# Patient Record
Sex: Male | Born: 1950 | Race: White | Hispanic: No | Marital: Married | Smoking: Former smoker
Health system: Southern US, Community
[De-identification: ages and names within clinical notes are randomized; demographics above are authoritative.]

## PROBLEM LIST (undated history)

## (undated) DIAGNOSIS — G4730 Sleep apnea, unspecified: Secondary | ICD-10-CM

## (undated) DIAGNOSIS — I10 Essential (primary) hypertension: Secondary | ICD-10-CM

## (undated) DIAGNOSIS — J189 Pneumonia, unspecified organism: Secondary | ICD-10-CM

## (undated) DIAGNOSIS — E119 Type 2 diabetes mellitus without complications: Secondary | ICD-10-CM

## (undated) DIAGNOSIS — T4145XA Adverse effect of unspecified anesthetic, initial encounter: Secondary | ICD-10-CM

## (undated) DIAGNOSIS — T8859XA Other complications of anesthesia, initial encounter: Secondary | ICD-10-CM

## (undated) DIAGNOSIS — Z950 Presence of cardiac pacemaker: Secondary | ICD-10-CM

## (undated) DIAGNOSIS — M1990 Unspecified osteoarthritis, unspecified site: Secondary | ICD-10-CM

## (undated) DIAGNOSIS — Z87442 Personal history of urinary calculi: Secondary | ICD-10-CM

## (undated) HISTORY — PX: APPENDECTOMY: SHX54

## (undated) HISTORY — PX: HERNIA REPAIR: SHX51

## (undated) HISTORY — PX: HEMORRHOID SURGERY: SHX153

## (undated) HISTORY — PX: CYST REMOVAL TRUNK: SHX6283

---

## 2010-01-23 ENCOUNTER — Other Ambulatory Visit
Admission: RE | Admit: 2010-01-23 | Discharge: 2010-01-23 | Payer: Self-pay | Source: Home / Self Care | Attending: Oncology | Admitting: Oncology

## 2014-02-18 DIAGNOSIS — E785 Hyperlipidemia, unspecified: Secondary | ICD-10-CM | POA: Diagnosis not present

## 2014-02-18 DIAGNOSIS — E119 Type 2 diabetes mellitus without complications: Secondary | ICD-10-CM | POA: Diagnosis not present

## 2014-02-18 DIAGNOSIS — Z1389 Encounter for screening for other disorder: Secondary | ICD-10-CM | POA: Diagnosis not present

## 2014-02-18 DIAGNOSIS — I10 Essential (primary) hypertension: Secondary | ICD-10-CM | POA: Diagnosis not present

## 2014-02-18 DIAGNOSIS — G4733 Obstructive sleep apnea (adult) (pediatric): Secondary | ICD-10-CM | POA: Diagnosis not present

## 2014-02-18 DIAGNOSIS — J329 Chronic sinusitis, unspecified: Secondary | ICD-10-CM | POA: Diagnosis not present

## 2014-02-18 DIAGNOSIS — D471 Chronic myeloproliferative disease: Secondary | ICD-10-CM | POA: Diagnosis not present

## 2014-02-18 DIAGNOSIS — Z9181 History of falling: Secondary | ICD-10-CM | POA: Diagnosis not present

## 2014-04-15 DIAGNOSIS — I10 Essential (primary) hypertension: Secondary | ICD-10-CM | POA: Diagnosis not present

## 2014-04-15 DIAGNOSIS — E876 Hypokalemia: Secondary | ICD-10-CM | POA: Diagnosis not present

## 2014-04-15 DIAGNOSIS — Z7982 Long term (current) use of aspirin: Secondary | ICD-10-CM | POA: Diagnosis not present

## 2014-04-15 DIAGNOSIS — E118 Type 2 diabetes mellitus with unspecified complications: Secondary | ICD-10-CM | POA: Diagnosis not present

## 2014-04-15 DIAGNOSIS — R05 Cough: Secondary | ICD-10-CM | POA: Diagnosis not present

## 2014-04-15 DIAGNOSIS — E780 Pure hypercholesterolemia: Secondary | ICD-10-CM | POA: Diagnosis not present

## 2014-04-15 DIAGNOSIS — J324 Chronic pansinusitis: Secondary | ICD-10-CM | POA: Diagnosis not present

## 2014-04-15 DIAGNOSIS — J189 Pneumonia, unspecified organism: Secondary | ICD-10-CM | POA: Diagnosis not present

## 2014-04-15 DIAGNOSIS — J329 Chronic sinusitis, unspecified: Secondary | ICD-10-CM | POA: Diagnosis not present

## 2014-04-15 DIAGNOSIS — R040 Epistaxis: Secondary | ICD-10-CM | POA: Diagnosis not present

## 2014-04-15 DIAGNOSIS — Z87891 Personal history of nicotine dependence: Secondary | ICD-10-CM | POA: Diagnosis not present

## 2014-08-26 DIAGNOSIS — Z1389 Encounter for screening for other disorder: Secondary | ICD-10-CM | POA: Diagnosis not present

## 2014-08-26 DIAGNOSIS — E119 Type 2 diabetes mellitus without complications: Secondary | ICD-10-CM | POA: Diagnosis not present

## 2014-08-26 DIAGNOSIS — Z9181 History of falling: Secondary | ICD-10-CM | POA: Diagnosis not present

## 2014-08-26 DIAGNOSIS — I10 Essential (primary) hypertension: Secondary | ICD-10-CM | POA: Diagnosis not present

## 2014-08-26 DIAGNOSIS — E785 Hyperlipidemia, unspecified: Secondary | ICD-10-CM | POA: Diagnosis not present

## 2014-08-26 DIAGNOSIS — Z6841 Body Mass Index (BMI) 40.0 and over, adult: Secondary | ICD-10-CM | POA: Diagnosis not present

## 2014-12-19 DIAGNOSIS — G4733 Obstructive sleep apnea (adult) (pediatric): Secondary | ICD-10-CM | POA: Diagnosis not present

## 2015-03-03 DIAGNOSIS — E785 Hyperlipidemia, unspecified: Secondary | ICD-10-CM | POA: Diagnosis not present

## 2015-03-03 DIAGNOSIS — E119 Type 2 diabetes mellitus without complications: Secondary | ICD-10-CM | POA: Diagnosis not present

## 2015-03-03 DIAGNOSIS — I10 Essential (primary) hypertension: Secondary | ICD-10-CM | POA: Diagnosis not present

## 2015-03-20 DIAGNOSIS — G4733 Obstructive sleep apnea (adult) (pediatric): Secondary | ICD-10-CM | POA: Diagnosis not present

## 2015-04-19 DIAGNOSIS — J0100 Acute maxillary sinusitis, unspecified: Secondary | ICD-10-CM | POA: Diagnosis not present

## 2015-06-26 DIAGNOSIS — G4733 Obstructive sleep apnea (adult) (pediatric): Secondary | ICD-10-CM | POA: Diagnosis not present

## 2015-07-20 DIAGNOSIS — E119 Type 2 diabetes mellitus without complications: Secondary | ICD-10-CM | POA: Diagnosis not present

## 2015-07-20 DIAGNOSIS — H2513 Age-related nuclear cataract, bilateral: Secondary | ICD-10-CM | POA: Diagnosis not present

## 2015-07-27 DIAGNOSIS — G4733 Obstructive sleep apnea (adult) (pediatric): Secondary | ICD-10-CM | POA: Diagnosis not present

## 2015-08-26 DIAGNOSIS — G4733 Obstructive sleep apnea (adult) (pediatric): Secondary | ICD-10-CM | POA: Diagnosis not present

## 2015-09-01 DIAGNOSIS — E785 Hyperlipidemia, unspecified: Secondary | ICD-10-CM | POA: Diagnosis not present

## 2015-09-01 DIAGNOSIS — Z1389 Encounter for screening for other disorder: Secondary | ICD-10-CM | POA: Diagnosis not present

## 2015-09-01 DIAGNOSIS — I10 Essential (primary) hypertension: Secondary | ICD-10-CM | POA: Diagnosis not present

## 2015-09-01 DIAGNOSIS — E119 Type 2 diabetes mellitus without complications: Secondary | ICD-10-CM | POA: Diagnosis not present

## 2015-09-26 DIAGNOSIS — G4733 Obstructive sleep apnea (adult) (pediatric): Secondary | ICD-10-CM | POA: Diagnosis not present

## 2015-10-27 DIAGNOSIS — G4733 Obstructive sleep apnea (adult) (pediatric): Secondary | ICD-10-CM | POA: Diagnosis not present

## 2015-11-26 DIAGNOSIS — G4733 Obstructive sleep apnea (adult) (pediatric): Secondary | ICD-10-CM | POA: Diagnosis not present

## 2015-12-27 DIAGNOSIS — G4733 Obstructive sleep apnea (adult) (pediatric): Secondary | ICD-10-CM | POA: Diagnosis not present

## 2016-01-22 DIAGNOSIS — M5441 Lumbago with sciatica, right side: Secondary | ICD-10-CM | POA: Diagnosis not present

## 2016-01-22 DIAGNOSIS — M5442 Lumbago with sciatica, left side: Secondary | ICD-10-CM | POA: Diagnosis not present

## 2016-01-25 DIAGNOSIS — M47816 Spondylosis without myelopathy or radiculopathy, lumbar region: Secondary | ICD-10-CM | POA: Diagnosis not present

## 2016-01-25 DIAGNOSIS — M5432 Sciatica, left side: Secondary | ICD-10-CM | POA: Diagnosis not present

## 2016-01-25 DIAGNOSIS — I10 Essential (primary) hypertension: Secondary | ICD-10-CM | POA: Diagnosis not present

## 2016-01-25 DIAGNOSIS — M5431 Sciatica, right side: Secondary | ICD-10-CM | POA: Diagnosis not present

## 2016-01-25 DIAGNOSIS — M5186 Other intervertebral disc disorders, lumbar region: Secondary | ICD-10-CM | POA: Diagnosis not present

## 2016-01-26 DIAGNOSIS — G4733 Obstructive sleep apnea (adult) (pediatric): Secondary | ICD-10-CM | POA: Diagnosis not present

## 2016-02-02 DIAGNOSIS — M5431 Sciatica, right side: Secondary | ICD-10-CM | POA: Diagnosis not present

## 2016-02-02 DIAGNOSIS — M48061 Spinal stenosis, lumbar region without neurogenic claudication: Secondary | ICD-10-CM | POA: Diagnosis not present

## 2016-02-16 DIAGNOSIS — M5442 Lumbago with sciatica, left side: Secondary | ICD-10-CM | POA: Diagnosis not present

## 2016-02-16 DIAGNOSIS — M5441 Lumbago with sciatica, right side: Secondary | ICD-10-CM | POA: Diagnosis not present

## 2016-02-20 DIAGNOSIS — S91111A Laceration without foreign body of right great toe without damage to nail, initial encounter: Secondary | ICD-10-CM | POA: Diagnosis not present

## 2016-02-20 DIAGNOSIS — S82831A Other fracture of upper and lower end of right fibula, initial encounter for closed fracture: Secondary | ICD-10-CM | POA: Diagnosis not present

## 2016-02-20 DIAGNOSIS — S91114A Laceration without foreign body of right lesser toe(s) without damage to nail, initial encounter: Secondary | ICD-10-CM | POA: Diagnosis not present

## 2016-02-26 DIAGNOSIS — M5126 Other intervertebral disc displacement, lumbar region: Secondary | ICD-10-CM | POA: Diagnosis not present

## 2016-02-26 DIAGNOSIS — E119 Type 2 diabetes mellitus without complications: Secondary | ICD-10-CM | POA: Diagnosis not present

## 2016-02-26 DIAGNOSIS — E785 Hyperlipidemia, unspecified: Secondary | ICD-10-CM | POA: Diagnosis not present

## 2016-02-26 DIAGNOSIS — S82831A Other fracture of upper and lower end of right fibula, initial encounter for closed fracture: Secondary | ICD-10-CM | POA: Diagnosis not present

## 2016-02-26 DIAGNOSIS — G4733 Obstructive sleep apnea (adult) (pediatric): Secondary | ICD-10-CM | POA: Diagnosis not present

## 2016-02-26 DIAGNOSIS — I10 Essential (primary) hypertension: Secondary | ICD-10-CM | POA: Diagnosis not present

## 2016-02-27 DIAGNOSIS — S8264XA Nondisplaced fracture of lateral malleolus of right fibula, initial encounter for closed fracture: Secondary | ICD-10-CM | POA: Diagnosis not present

## 2016-03-12 DIAGNOSIS — M5442 Lumbago with sciatica, left side: Secondary | ICD-10-CM | POA: Diagnosis not present

## 2016-03-26 DIAGNOSIS — M5441 Lumbago with sciatica, right side: Secondary | ICD-10-CM | POA: Diagnosis not present

## 2016-03-26 DIAGNOSIS — M48062 Spinal stenosis, lumbar region with neurogenic claudication: Secondary | ICD-10-CM | POA: Diagnosis not present

## 2016-03-27 DIAGNOSIS — S8264XA Nondisplaced fracture of lateral malleolus of right fibula, initial encounter for closed fracture: Secondary | ICD-10-CM | POA: Diagnosis not present

## 2016-03-28 DIAGNOSIS — G4733 Obstructive sleep apnea (adult) (pediatric): Secondary | ICD-10-CM | POA: Diagnosis not present

## 2016-04-03 DIAGNOSIS — Z2821 Immunization not carried out because of patient refusal: Secondary | ICD-10-CM | POA: Diagnosis not present

## 2016-04-05 ENCOUNTER — Ambulatory Visit: Payer: Self-pay | Admitting: Physician Assistant

## 2016-04-10 ENCOUNTER — Encounter (HOSPITAL_COMMUNITY)
Admission: RE | Admit: 2016-04-10 | Discharge: 2016-04-10 | Disposition: A | Discharge status: Home / Self Care | Payer: Medicare Other | Source: Ambulatory Visit | Attending: Orthopedic Surgery | Admitting: Orthopedic Surgery

## 2016-04-10 ENCOUNTER — Encounter (HOSPITAL_COMMUNITY): Payer: Self-pay

## 2016-04-10 ENCOUNTER — Other Ambulatory Visit (HOSPITAL_COMMUNITY): Payer: Self-pay | Admitting: *Deleted

## 2016-04-10 DIAGNOSIS — Z01810 Encounter for preprocedural cardiovascular examination: Secondary | ICD-10-CM | POA: Insufficient documentation

## 2016-04-10 DIAGNOSIS — M48061 Spinal stenosis, lumbar region without neurogenic claudication: Secondary | ICD-10-CM | POA: Insufficient documentation

## 2016-04-10 DIAGNOSIS — I440 Atrioventricular block, first degree: Secondary | ICD-10-CM | POA: Diagnosis not present

## 2016-04-10 DIAGNOSIS — Z01812 Encounter for preprocedural laboratory examination: Secondary | ICD-10-CM | POA: Insufficient documentation

## 2016-04-10 HISTORY — DX: Adverse effect of unspecified anesthetic, initial encounter: T41.45XA

## 2016-04-10 HISTORY — DX: Sleep apnea, unspecified: G47.30

## 2016-04-10 HISTORY — DX: Unspecified osteoarthritis, unspecified site: M19.90

## 2016-04-10 HISTORY — DX: Essential (primary) hypertension: I10

## 2016-04-10 HISTORY — DX: Pneumonia, unspecified organism: J18.9

## 2016-04-10 HISTORY — DX: Personal history of urinary calculi: Z87.442

## 2016-04-10 HISTORY — DX: Other complications of anesthesia, initial encounter: T88.59XA

## 2016-04-10 HISTORY — DX: Type 2 diabetes mellitus without complications: E11.9

## 2016-04-10 LAB — BASIC METABOLIC PANEL
Anion gap: 10 (ref 5–15)
BUN: 17 mg/dL (ref 6–20)
CALCIUM: 10 mg/dL (ref 8.9–10.3)
CO2: 34 mmol/L — ABNORMAL HIGH (ref 22–32)
CREATININE: 0.89 mg/dL (ref 0.61–1.24)
Chloride: 94 mmol/L — ABNORMAL LOW (ref 101–111)
GFR calc Af Amer: >60 mL/min (ref >60)
GLUCOSE: 165 mg/dL — AB (ref 65–99)
Potassium: 3.5 mmol/L (ref 3.5–5.1)
Sodium: 138 mmol/L (ref 135–145)

## 2016-04-10 LAB — CBC
HCT: 44.2 % (ref 39.0–52.0)
Hemoglobin: 14.8 g/dL (ref 13.0–17.0)
MCH: 32.9 pg (ref 26.0–34.0)
MCHC: 33.5 g/dL (ref 30.0–36.0)
MCV: 98.2 fL (ref 78.0–100.0)
Platelets: 226 10*3/uL (ref 150–400)
RBC: 4.5 MIL/uL (ref 4.22–5.81)
RDW: 13.6 % (ref 11.5–15.5)
WBC: 9.9 10*3/uL (ref 4.0–10.5)

## 2016-04-10 LAB — GLUCOSE, CAPILLARY: GLUCOSE-CAPILLARY: 170 mg/dL — AB (ref 65–99)

## 2016-04-10 LAB — SURGICAL PCR SCREEN
MRSA, PCR: NEGATIVE
STAPHYLOCOCCUS AUREUS: NEGATIVE

## 2016-04-10 NOTE — Progress Notes (Addendum)
Pt denies cardiac history, chest pain or sob. Pt is diabetic. Last A1C was in January, 2018 and it was 7.2 per pt. Pt doesn't check his blood sugar often at home, but states it's usually around 140. CBG today was 170, pt states he had not eaten nor taken his Metformin. Requested A1C report from PCP's office (Amy Moon, NP) and a previous EKG, Anderson Malta at office could not find a previous EKG.

## 2016-04-10 NOTE — Pre-Procedure Instructions (Signed)
Savan Eryck Negron  04/10/2016    Your procedure is scheduled on Wednesday, April 17, 2016 at 8:30 AM.   Report to [facility] Entrance "A" Admitting Office at 6:30 AM.   Call this number if you have problems the morning of surgery: [phone]   Questions prior to day of surgery, please call [phone] between 8 & 4 PM.   Remember:  Do not eat food or drink liquids after midnight Tuesday, 04/16/16.  Take these medicines the morning of surgery with A SIP OF WATER: Atenolol-Chlorthalidone (Tenoretic)  Do not use NSAIDS (Ibuprofen, Aleve, etc) or Aspirin products 5 days prior to surgery.  How to Manage Your Diabetes Before Surgery   Why is it important to control my blood sugar before and after surgery?   Improving blood sugar levels before and after surgery helps healing and can limit problems.  A way of improving blood sugar control is eating a healthy diet by:  - Eating less sugar and carbohydrates  - Increasing activity/exercise  - Talk with your doctor about reaching your blood sugar goals  High blood sugars (greater than 180 mg/dL) can raise your risk of infections and slow down your recovery so you will need to focus on controlling your diabetes during the weeks before surgery.  Make sure that the doctor who takes care of your diabetes knows about your planned surgery including the date and location.  How do I manage my blood sugars before surgery?   Check your blood sugar at least 4 times a day, 2 days before surgery to make sure that they are not too high or low.  Check your blood sugar the morning of your surgery when you wake up and every 2 hours until you get to the Short-Stay unit.  Treat a low blood sugar (less than 70 mg/dL) with 1/2 cup of clear juice (cranberry or apple), 4 glucose tablets, OR glucose gel.  Recheck blood sugar in 15 minutes after treatment (to make sure it is greater than 70 mg/dL).  If blood sugar is not greater than 70 mg/dL  on re-check, call [phone] for further instructions.   Report your blood sugar to the Short-Stay nurse when you get to Short-Stay.  References:  [facility], 2007 "How to Manage your Diabetes Before and After Surgery".  What do I do about my diabetes medications?   Do not take oral diabetes medicines (pills) the morning of surgery.   Do not wear jewelry.  Do not wear lotions, powders or cologne.  Men may shave face and neck.  Do not bring valuables to the hospital.  [facility] is not responsible for any belongings or valuables.  Contacts, dentures or bridgework may not be worn into surgery.  Leave your suitcase in the car.  After surgery it may be brought to your room.  For patients admitted to the hospital, discharge time will be determined by your treatment team.  Patients discharged the day of surgery will not be allowed to drive home.   Special instructions:   - Preparing for Surgery  Before surgery, you can play an important role.  Because skin is not sterile, your skin needs to be as free of germs as possible.  You can reduce the number of germs on you skin by washing with CHG (chlorahexidine gluconate) soap before surgery.  CHG is an antiseptic cleaner which kills germs and bonds with the skin to continue killing germs even after washing.  Please  DO NOT use if you have an allergy to CHG or antibacterial soaps.  If your skin becomes reddened/irritated stop using the CHG and inform your nurse when you arrive at Short Stay.  Do not shave (including legs and underarms) for at least 48 hours prior to the first CHG shower.  You may shave your face.  Please follow these instructions carefully:   1.  Shower with CHG Soap the night before surgery and the                    morning of Surgery.  2.  If you choose to wash your hair, wash your hair first as usual with your       normal shampoo.  3.  After you shampoo, rinse your hair and body  thoroughly to remove the shampoo.  4.  Use CHG as you would any other liquid soap.  You can apply chg directly       to the skin and wash gently with scrungie or a clean washcloth.  5.  Apply the CHG Soap to your body ONLY FROM THE NECK DOWN.        Do not use on open wounds or open sores.  Avoid contact with your eyes, ears, mouth and genitals (private parts).  Wash genitals (private parts) with your normal soap.  6.  Wash thoroughly, paying special attention to the area where your surgery        will be performed.  7.  Thoroughly rinse your body with warm water from the neck down.  8.  DO NOT shower/wash with your normal soap after using and rinsing off       the CHG Soap.  9.  Pat yourself dry with a clean towel.            10.  Wear clean pajamas.            11.  Place clean sheets on your bed the night of your first shower and do not        sleep with pets.  Day of Surgery  Do not apply any lotions the morning of surgery.  Please wear clean clothes to the hospital.   Please read over the fact sheets that you were given.

## 2016-04-11 NOTE — Progress Notes (Signed)
Anesthesia Chart Review:  Pt is a 66 year old male scheduled for [name] lumbar laminectomy/ decompression microdiscectomy on 04/17/2016 with Melina Schools, MD.   - PCP is Laverna Peace, NP  PMH includes:  HTN, DM, OSA. Former smoker. BMI 33.  Anesthesia history: Patient reports he woke up early during hemorrhoid surgery.  Medications include: Atenolol-chlorthalidone, lovastatin, metformin, potassium  Preoperative labs reviewed.  Glucose 165. HbA1c was 7.2 at PCPs office on 02/27/16  EKG 04/10/16: Sinus rhythm with 1st degree A-V block. Nonspecific ST and T wave abnormality  If no changes, I anticipate pt can proceed with surgery as scheduled.   Willeen Cass, FNP-BC [facility]/Anesthesiology Phone: [phone] 04/11/2016 2:12 PM

## 2016-04-17 ENCOUNTER — Encounter (HOSPITAL_COMMUNITY)
Admission: AD | Disposition: A | Discharge status: Home Health | Payer: Self-pay | Source: Ambulatory Visit | Attending: Orthopedic Surgery

## 2016-04-17 ENCOUNTER — Ambulatory Visit (HOSPITAL_COMMUNITY): Payer: Medicare Other

## 2016-04-17 ENCOUNTER — Ambulatory Visit (HOSPITAL_COMMUNITY): Payer: Medicare Other | Admitting: Anesthesiology

## 2016-04-17 ENCOUNTER — Inpatient Hospital Stay (HOSPITAL_COMMUNITY)
Admission: AD | Admit: 2016-04-17 | Discharge: 2016-04-19 | DRG: 517 | Disposition: A | Discharge status: Home Health | Payer: Medicare Other | Source: Ambulatory Visit | Attending: Orthopedic Surgery | Admitting: Orthopedic Surgery

## 2016-04-17 ENCOUNTER — Encounter (HOSPITAL_COMMUNITY): Payer: Self-pay | Admitting: *Deleted

## 2016-04-17 ENCOUNTER — Ambulatory Visit (HOSPITAL_COMMUNITY): Payer: Medicare Other | Admitting: Emergency Medicine

## 2016-04-17 DIAGNOSIS — Z419 Encounter for procedure for purposes other than remedying health state, unspecified: Secondary | ICD-10-CM

## 2016-04-17 DIAGNOSIS — M48062 Spinal stenosis, lumbar region with neurogenic claudication: Secondary | ICD-10-CM | POA: Diagnosis not present

## 2016-04-17 DIAGNOSIS — M48061 Spinal stenosis, lumbar region without neurogenic claudication: Secondary | ICD-10-CM | POA: Diagnosis not present

## 2016-04-17 DIAGNOSIS — E119 Type 2 diabetes mellitus without complications: Secondary | ICD-10-CM | POA: Diagnosis not present

## 2016-04-17 DIAGNOSIS — M5442 Lumbago with sciatica, left side: Secondary | ICD-10-CM | POA: Diagnosis not present

## 2016-04-17 DIAGNOSIS — Z87891 Personal history of nicotine dependence: Secondary | ICD-10-CM

## 2016-04-17 DIAGNOSIS — Z7984 Long term (current) use of oral hypoglycemic drugs: Secondary | ICD-10-CM | POA: Diagnosis not present

## 2016-04-17 DIAGNOSIS — M4800 Spinal stenosis, site unspecified: Secondary | ICD-10-CM

## 2016-04-17 DIAGNOSIS — M5441 Lumbago with sciatica, right side: Secondary | ICD-10-CM | POA: Diagnosis not present

## 2016-04-17 DIAGNOSIS — M4807 Spinal stenosis, lumbosacral region: Secondary | ICD-10-CM | POA: Diagnosis not present

## 2016-04-17 DIAGNOSIS — G4730 Sleep apnea, unspecified: Secondary | ICD-10-CM | POA: Diagnosis not present

## 2016-04-17 DIAGNOSIS — I10 Essential (primary) hypertension: Secondary | ICD-10-CM | POA: Diagnosis present

## 2016-04-17 DIAGNOSIS — Z79899 Other long term (current) drug therapy: Secondary | ICD-10-CM

## 2016-04-17 HISTORY — DX: Spinal stenosis, site unspecified: M48.00

## 2016-04-17 HISTORY — PX: LUMBAR LAMINECTOMY/DECOMPRESSION MICRODISCECTOMY: SHX5026

## 2016-04-17 LAB — GLUCOSE, CAPILLARY
GLUCOSE-CAPILLARY: 205 mg/dL — AB (ref 65–99)
GLUCOSE-CAPILLARY: 145 mg/dL — AB (ref 65–99)
Glucose-Capillary: 191 mg/dL — ABNORMAL HIGH (ref 65–99)
Glucose-Capillary: 164 mg/dL — ABNORMAL HIGH (ref 65–99)

## 2016-04-17 SURGERY — LUMBAR LAMINECTOMY/DECOMPRESSION MICRODISCECTOMY 2 LEVELS
Anesthesia: General

## 2016-04-17 MED ORDER — OXYCODONE HCL 5 MG PO TABS
10.0000 mg | ORAL_TABLET | ORAL | Status: DC | PRN
  Administered 2016-04-17 – 2016-04-19 (×9): 10 mg via ORAL
  Filled 2016-04-17 (×9): qty 2

## 2016-04-17 MED ORDER — PROPOFOL 10 MG/ML IV BOLUS
INTRAVENOUS | Status: AC
  Filled 2016-04-17: qty 20

## 2016-04-17 MED ORDER — SUGAMMADEX SODIUM 500 MG/5ML IV SOLN
INTRAVENOUS | Status: DC | PRN
  Administered 2016-04-17: 395.6 mg via INTRAVENOUS

## 2016-04-17 MED ORDER — ONDANSETRON HCL 4 MG PO TABS: 4.0000 mg | ORAL_TABLET | Freq: Three times a day (TID) | ORAL | 0 refills | Status: DC | PRN

## 2016-04-17 MED ORDER — METFORMIN HCL 500 MG PO TABS
500.0000 mg | ORAL_TABLET | Freq: Two times a day (BID) | ORAL | Status: DC
  Administered 2016-04-17 – 2016-04-19 (×4): 500 mg via ORAL
  Filled 2016-04-17 (×4): qty 1

## 2016-04-17 MED ORDER — HEMOSTATIC AGENTS (NO CHARGE) OPTIME
TOPICAL | Status: DC | PRN
  Administered 2016-04-17: 1 via TOPICAL

## 2016-04-17 MED ORDER — ATENOLOL-CHLORTHALIDONE 50-25 MG PO TABS: 1.0000 | ORAL_TABLET | Freq: Every day | ORAL | Status: DC

## 2016-04-17 MED ORDER — PHENOL 1.4 % MT LIQD
1.0000 | OROMUCOSAL | Status: DC | PRN
  Administered 2016-04-17: 1 via OROMUCOSAL
  Filled 2016-04-17: qty 177

## 2016-04-17 MED ORDER — HEMOSTATIC AGENTS (NO CHARGE) OPTIME
TOPICAL | Status: DC | PRN
  Administered 2016-04-17: 2 via TOPICAL

## 2016-04-17 MED ORDER — PRAVASTATIN SODIUM 40 MG PO TABS
20.0000 mg | ORAL_TABLET | Freq: Every day | ORAL | Status: DC
  Administered 2016-04-17 – 2016-04-18 (×2): 20 mg via ORAL
  Filled 2016-04-17 (×2): qty 1

## 2016-04-17 MED ORDER — LACTATED RINGERS IV SOLN
INTRAVENOUS | Status: DC | PRN
  Administered 2016-04-17: 08:00:00 via INTRAVENOUS

## 2016-04-17 MED ORDER — INSULIN ASPART 100 UNIT/ML ~~LOC~~ SOLN
0.0000 [IU] | Freq: Every day | SUBCUTANEOUS | Status: DC
  Administered 2016-04-18: 2 [IU] via SUBCUTANEOUS

## 2016-04-17 MED ORDER — THROMBIN 5000 UNITS EX SOLR
CUTANEOUS | Status: DC | PRN
  Administered 2016-04-17: 5000 [IU] via TOPICAL

## 2016-04-17 MED ORDER — THROMBIN 20000 UNITS EX SOLR
CUTANEOUS | Status: AC
  Filled 2016-04-17: qty 20000

## 2016-04-17 MED ORDER — BUPIVACAINE HCL 0.25 % IJ SOLN
INTRAMUSCULAR | Status: DC | PRN
  Administered 2016-04-17: 10 mL

## 2016-04-17 MED ORDER — SODIUM CHLORIDE 0.9% FLUSH: 3.0000 mL | INTRAVENOUS | Status: DC | PRN

## 2016-04-17 MED ORDER — EPINEPHRINE PF 1 MG/ML IJ SOLN
INTRAMUSCULAR | Status: AC
  Filled 2016-04-17: qty 1

## 2016-04-17 MED ORDER — CEFAZOLIN SODIUM-DEXTROSE 2-4 GM/100ML-% IV SOLN
INTRAVENOUS | Status: AC
Start: 2016-04-17 — End: 2016-04-17
  Filled 2016-04-17: qty 100

## 2016-04-17 MED ORDER — METHOCARBAMOL 500 MG PO TABS: 500.0000 mg | ORAL_TABLET | Freq: Three times a day (TID) | ORAL | 0 refills | Status: DC | PRN

## 2016-04-17 MED ORDER — ACETAMINOPHEN 10 MG/ML IV SOLN
INTRAVENOUS | Status: DC | PRN
  Administered 2016-04-17: 1000 mg via INTRAVENOUS

## 2016-04-17 MED ORDER — FENTANYL CITRATE (PF) 100 MCG/2ML IJ SOLN
INTRAMUSCULAR | Status: DC | PRN
  Administered 2016-04-17: 200 ug via INTRAVENOUS

## 2016-04-17 MED ORDER — CEFAZOLIN SODIUM-DEXTROSE 2-4 GM/100ML-% IV SOLN
2.0000 g | Freq: Three times a day (TID) | INTRAVENOUS | Status: AC
  Administered 2016-04-17 (×2): 2 g via INTRAVENOUS
  Filled 2016-04-17: qty 100

## 2016-04-17 MED ORDER — ARTIFICIAL TEARS OP OINT
TOPICAL_OINTMENT | OPHTHALMIC | Status: DC | PRN
  Administered 2016-04-17: 1 via OPHTHALMIC

## 2016-04-17 MED ORDER — ONDANSETRON HCL 4 MG PO TABS: 4.0000 mg | ORAL_TABLET | Freq: Four times a day (QID) | ORAL | Status: DC | PRN

## 2016-04-17 MED ORDER — LIDOCAINE HCL (CARDIAC) 20 MG/ML IV SOLN
INTRAVENOUS | Status: DC | PRN
  Administered 2016-04-17: 100 mg via INTRAVENOUS

## 2016-04-17 MED ORDER — ROCURONIUM BROMIDE 100 MG/10ML IV SOLN
INTRAVENOUS | Status: DC | PRN
  Administered 2016-04-17: 20 mg via INTRAVENOUS
  Administered 2016-04-17: 50 mg via INTRAVENOUS
  Administered 2016-04-17: 20 mg via INTRAVENOUS
  Administered 2016-04-17: 10 mg via INTRAVENOUS

## 2016-04-17 MED ORDER — MIDAZOLAM HCL 2 MG/2ML IJ SOLN
INTRAMUSCULAR | Status: AC
  Filled 2016-04-17: qty 2

## 2016-04-17 MED ORDER — DEXTROSE 5 % IV SOLN
500.0000 mg | Freq: Four times a day (QID) | INTRAVENOUS | Status: DC | PRN
  Filled 2016-04-17: qty 5

## 2016-04-17 MED ORDER — INSULIN ASPART 100 UNIT/ML ~~LOC~~ SOLN
0.0000 [IU] | Freq: Three times a day (TID) | SUBCUTANEOUS | Status: DC
  Administered 2016-04-17: 5 [IU] via SUBCUTANEOUS
  Administered 2016-04-18 (×3): 3 [IU] via SUBCUTANEOUS
  Administered 2016-04-19 (×2): 2 [IU] via SUBCUTANEOUS

## 2016-04-17 MED ORDER — MORPHINE SULFATE (PF) 4 MG/ML IV SOLN: 2.0000 mg | INTRAVENOUS | Status: DC | PRN

## 2016-04-17 MED ORDER — BUPIVACAINE HCL (PF) 0.25 % IJ SOLN
INTRAMUSCULAR | Status: AC
  Filled 2016-04-17: qty 30

## 2016-04-17 MED ORDER — ACETAMINOPHEN 325 MG PO TABS
650.0000 mg | ORAL_TABLET | ORAL | Status: DC | PRN
  Administered 2016-04-17 – 2016-04-18 (×3): 650 mg via ORAL
  Filled 2016-04-17 (×3): qty 2

## 2016-04-17 MED ORDER — POLYETHYLENE GLYCOL 3350 17 G PO PACK
17.0000 g | PACK | Freq: Every day | ORAL | Status: DC | PRN
  Administered 2016-04-18: 17 g via ORAL
  Filled 2016-04-17: qty 1

## 2016-04-17 MED ORDER — MENTHOL 3 MG MT LOZG: 1.0000 | LOZENGE | OROMUCOSAL | Status: DC | PRN

## 2016-04-17 MED ORDER — MIDAZOLAM HCL 5 MG/5ML IJ SOLN
INTRAMUSCULAR | Status: DC | PRN
  Administered 2016-04-17: 2 mg via INTRAVENOUS

## 2016-04-17 MED ORDER — CEFAZOLIN SODIUM-DEXTROSE 2-4 GM/100ML-% IV SOLN
2.0000 g | INTRAVENOUS | Status: AC
  Administered 2016-04-17: 2 g via INTRAVENOUS

## 2016-04-17 MED ORDER — ACETAMINOPHEN 10 MG/ML IV SOLN
INTRAVENOUS | Status: AC
  Filled 2016-04-17: qty 100

## 2016-04-17 MED ORDER — EPINEPHRINE PF 1 MG/ML IJ SOLN
INTRAMUSCULAR | Status: DC | PRN
  Administered 2016-04-17: .15 mL

## 2016-04-17 MED ORDER — METHOCARBAMOL 500 MG PO TABS
500.0000 mg | ORAL_TABLET | Freq: Four times a day (QID) | ORAL | Status: DC | PRN
  Administered 2016-04-17 – 2016-04-19 (×6): 500 mg via ORAL
  Filled 2016-04-17 (×6): qty 1

## 2016-04-17 MED ORDER — ONDANSETRON HCL 4 MG/2ML IJ SOLN
INTRAMUSCULAR | Status: DC | PRN
  Administered 2016-04-17: 4 mg via INTRAVENOUS

## 2016-04-17 MED ORDER — ACETAMINOPHEN 650 MG RE SUPP: 650.0000 mg | RECTAL | Status: DC | PRN

## 2016-04-17 MED ORDER — PROPOFOL 10 MG/ML IV BOLUS
INTRAVENOUS | Status: DC | PRN
  Administered 2016-04-17: 150 mg via INTRAVENOUS

## 2016-04-17 MED ORDER — ONDANSETRON HCL 4 MG/2ML IJ SOLN: 4.0000 mg | Freq: Once | INTRAMUSCULAR | Status: DC | PRN

## 2016-04-17 MED ORDER — LACTATED RINGERS IV SOLN: INTRAVENOUS | Status: DC

## 2016-04-17 MED ORDER — OXYCODONE-ACETAMINOPHEN 10-325 MG PO TABS: 1.0000 | ORAL_TABLET | ORAL | 0 refills | Status: DC | PRN

## 2016-04-17 MED ORDER — PHENYLEPHRINE HCL 10 MG/ML IJ SOLN
INTRAVENOUS | Status: DC | PRN
  Administered 2016-04-17: 25 ug/min via INTRAVENOUS

## 2016-04-17 MED ORDER — POTASSIUM CHLORIDE CRYS ER 20 MEQ PO TBCR
20.0000 meq | EXTENDED_RELEASE_TABLET | Freq: Every day | ORAL | Status: DC
  Administered 2016-04-18 – 2016-04-19 (×2): 20 meq via ORAL
  Filled 2016-04-17 (×2): qty 1

## 2016-04-17 MED ORDER — MEPERIDINE HCL 25 MG/ML IJ SOLN: 6.2500 mg | INTRAMUSCULAR | Status: DC | PRN

## 2016-04-17 MED ORDER — ONDANSETRON HCL 4 MG/2ML IJ SOLN: 4.0000 mg | Freq: Four times a day (QID) | INTRAMUSCULAR | Status: DC | PRN

## 2016-04-17 MED ORDER — CHLORTHALIDONE 25 MG PO TABS
25.0000 mg | ORAL_TABLET | Freq: Every day | ORAL | Status: DC
  Administered 2016-04-18 – 2016-04-19 (×2): 25 mg via ORAL
  Filled 2016-04-17 (×2): qty 1

## 2016-04-17 MED ORDER — HYDROMORPHONE HCL 1 MG/ML IJ SOLN: 0.2500 mg | INTRAMUSCULAR | Status: DC | PRN

## 2016-04-17 MED ORDER — SODIUM CHLORIDE 0.9% FLUSH
3.0000 mL | Freq: Two times a day (BID) | INTRAVENOUS | Status: DC
  Administered 2016-04-17 – 2016-04-18 (×3): 3 mL via INTRAVENOUS

## 2016-04-17 MED ORDER — ATENOLOL 50 MG PO TABS
50.0000 mg | ORAL_TABLET | Freq: Every day | ORAL | Status: DC
  Administered 2016-04-18 – 2016-04-19 (×2): 50 mg via ORAL
  Filled 2016-04-17 (×2): qty 1

## 2016-04-17 MED ORDER — EPHEDRINE SULFATE 50 MG/ML IJ SOLN
INTRAMUSCULAR | Status: DC | PRN
  Administered 2016-04-17: 10 mg via INTRAVENOUS

## 2016-04-17 MED ORDER — ROCURONIUM BROMIDE 50 MG/5ML IV SOSY
PREFILLED_SYRINGE | INTRAVENOUS | Status: AC
  Filled 2016-04-17: qty 5

## 2016-04-17 MED ORDER — FENTANYL CITRATE (PF) 100 MCG/2ML IJ SOLN
INTRAMUSCULAR | Status: AC
  Filled 2016-04-17: qty 4

## 2016-04-17 SURGICAL SUPPLY — 55 items
BNDG GAUZE ELAST 4 BULKY (GAUZE/BANDAGES/DRESSINGS) IMPLANT
BUR EGG ELITE 4.0 (BURR) IMPLANT
CLSR STERI-STRIP ANTIMIC 1/2X4 (GAUZE/BANDAGES/DRESSINGS) ×2 IMPLANT
CORDS BIPOLAR (ELECTRODE) ×2 IMPLANT
DRAPE C-ARM 42X72 X-RAY (DRAPES) ×2 IMPLANT
DRAPE POUCH INSTRU U-SHP 10X18 (DRAPES) ×2 IMPLANT
DRAPE SURG 17X11 SM STRL (DRAPES) ×2 IMPLANT
DRAPE U-SHAPE 47X51 STRL (DRAPES) ×2 IMPLANT
DRSG AQUACEL AG ADV 3.5X 6 (GAUZE/BANDAGES/DRESSINGS) ×2 IMPLANT
DURAPREP 26ML APPLICATOR (WOUND CARE) ×2 IMPLANT
ELECT BLADE 4.0 EZ CLEAN MEGAD (MISCELLANEOUS) ×2
ELECT CAUTERY BLADE 6.4 (BLADE) ×2 IMPLANT
ELECT PENCIL ROCKER SW 15FT (MISCELLANEOUS) ×2 IMPLANT
ELECT REM PT RETURN 9FT ADLT (ELECTROSURGICAL) ×2
ELECTRODE BLDE 4.0 EZ CLN MEGD (MISCELLANEOUS) ×1 IMPLANT
ELECTRODE REM PT RTRN 9FT ADLT (ELECTROSURGICAL) ×1 IMPLANT
FLOSEAL (HEMOSTASIS) IMPLANT
GLOVE BIO SURGEON STRL SZ 6.5 (GLOVE) ×2 IMPLANT
GLOVE BIOGEL PI IND STRL 6.5 (GLOVE) ×1 IMPLANT
GLOVE BIOGEL PI IND STRL 8.5 (GLOVE) ×1 IMPLANT
GLOVE BIOGEL PI INDICATOR 6.5 (GLOVE) ×1
GLOVE BIOGEL PI INDICATOR 8.5 (GLOVE) ×1
GLOVE SS BIOGEL STRL SZ 8.5 (GLOVE) ×1 IMPLANT
GLOVE SUPERSENSE BIOGEL SZ 8.5 (GLOVE) ×1
GOWN STRL REUS W/ TWL LRG LVL3 (GOWN DISPOSABLE) ×1 IMPLANT
GOWN STRL REUS W/TWL 2XL LVL3 (GOWN DISPOSABLE) ×4 IMPLANT
GOWN STRL REUS W/TWL LRG LVL3 (GOWN DISPOSABLE) ×1
KIT BASIN OR (CUSTOM PROCEDURE TRAY) ×2 IMPLANT
NEEDLE 22X1 1/2 (OR ONLY) (NEEDLE) ×2 IMPLANT
NEEDLE SPNL 18GX3.5 QUINCKE PK (NEEDLE) ×4 IMPLANT
NS IRRIG 1000ML POUR BTL (IV SOLUTION) ×2 IMPLANT
PACK LAMINECTOMY ORTHO (CUSTOM PROCEDURE TRAY) ×2 IMPLANT
PACK UNIVERSAL I (CUSTOM PROCEDURE TRAY) ×2 IMPLANT
PATTIES SURGICAL .5 X.5 (GAUZE/BANDAGES/DRESSINGS) IMPLANT
PATTIES SURGICAL .5 X1 (DISPOSABLE) ×2 IMPLANT
SPONGE LAP 4X18 X RAY DECT (DISPOSABLE) IMPLANT
SPONGE SURGIFOAM ABS GEL 100 (HEMOSTASIS) ×2 IMPLANT
STAPLER VISISTAT 35W (STAPLE) IMPLANT
STRIP CLOSURE SKIN 1/2X4 (GAUZE/BANDAGES/DRESSINGS) IMPLANT
SURGIFLO W/THROMBIN 8M KIT (HEMOSTASIS) ×2 IMPLANT
SUT BONE WAX W31G (SUTURE) ×2 IMPLANT
SUT MON AB 3-0 SH 27 (SUTURE) ×1
SUT MON AB 3-0 SH27 (SUTURE) ×1 IMPLANT
SUT VIC AB 1 CT1 18XCR BRD 8 (SUTURE) ×1 IMPLANT
SUT VIC AB 1 CT1 27 (SUTURE) ×2
SUT VIC AB 1 CT1 27XBRD ANTBC (SUTURE) ×2 IMPLANT
SUT VIC AB 1 CT1 8-18 (SUTURE) ×1
SUT VIC AB 2-0 CT1 18 (SUTURE) ×6 IMPLANT
SUT VICRYL 0 UR6 27IN ABS (SUTURE) ×2 IMPLANT
SYR BULB IRRIGATION 50ML (SYRINGE) ×2 IMPLANT
SYR CONTROL 10ML LL (SYRINGE) ×2 IMPLANT
TOWEL OR 17X26 10 PK STRL BLUE (TOWEL DISPOSABLE) ×4 IMPLANT
TRAY FOLEY W/METER SILVER 16FR (SET/KITS/TRAYS/PACK) ×2 IMPLANT
WATER STERILE IRR 1000ML POUR (IV SOLUTION) IMPLANT
YANKAUER SUCT BULB TIP NO VENT (SUCTIONS) ×2 IMPLANT

## 2016-04-17 NOTE — Anesthesia Preprocedure Evaluation (Addendum)
Anesthesia Evaluation  Patient identified by MRN, date of birth, ID band Patient awake    Reviewed: Allergy & Precautions, H&P , NPO status , Patient's Chart, lab work & pertinent test results  Airway Mallampati: II  TM Distance: >3 FB Neck ROM: full    Dental  (+) Teeth Intact, Dental Advidsory Given, Caps   Pulmonary sleep apnea and Continuous Positive Airway Pressure Ventilation , former smoker,    Pulmonary exam normal        Cardiovascular hypertension, On Medications Normal cardiovascular exam Rhythm:regular Rate:Normal     Neuro/Psych    GI/Hepatic   Endo/Other  diabetes, Type 2, Oral Hypoglycemic Agents  Renal/GU      Musculoskeletal   Abdominal   Peds  Hematology   Anesthesia Other Findings   Reproductive/Obstetrics                            Anesthesia Physical Anesthesia Plan  ASA: III  Anesthesia Plan: General ETT   Post-op Pain Management:    Induction: Intravenous  Airway Management Planned: Oral ETT  Additional Equipment:   Intra-op Plan:   Post-operative Plan: Extubation in OR  Informed Consent: I have reviewed the patients History and Physical, chart, labs and discussed the procedure including the risks, benefits and alternatives for the proposed anesthesia with the patient or authorized representative who has indicated his/her understanding and acceptance.   Dental Advisory Given  Plan Discussed with: Surgeon and CRNA  Anesthesia Plan Comments:        Anesthesia Quick Evaluation

## 2016-04-17 NOTE — Anesthesia Procedure Notes (Signed)
Procedure Name: Intubation Date/Time: 04/17/2016 8:35 AM Performed by: Neldon Newport Pre-anesthesia Checklist: Timeout performed, Suction available, Patient being monitored, Emergency Drugs available and Patient identified Patient Re-evaluated:Patient Re-evaluated prior to inductionOxygen Delivery Method: Circle system utilized Preoxygenation: Pre-oxygenation with 100% oxygen Intubation Type: IV induction Ventilation: Mask ventilation without difficulty Laryngoscope Size: Mac and 3 Grade View: Grade II Tube type: Oral Tube size: 7.5 mm Number of attempts: 1 Placement Confirmation: breath sounds checked- equal and bilateral,  positive ETCO2 and ETT inserted through vocal cords under direct vision Secured at: 23 cm Tube secured with: Tape Dental Injury: Teeth and Oropharynx as per pre-operative assessment

## 2016-04-17 NOTE — OR Nursing (Signed)
Dr. Pascal Lux called from radiology and confirmed levels l5-S1, Dr. Rolena Infante notifiied.

## 2016-04-17 NOTE — Transfer of Care (Signed)
Immediate Anesthesia Transfer of Care Note  Patient: Travis Crawford  Procedure(s) Performed: Procedure(s): LUMBAR LAMINECTOMY/DECOMPRESSION MICRODISCECTOMY 2 LEVELS (N/A)  Patient Location: PACU  Anesthesia Type:General  Level of Consciousness: awake, alert  and oriented  Airway & Oxygen Therapy: Patient Spontanous Breathing and Patient connected to face mask oxygen  Post-op Assessment: Report given to RN, Post -op Vital signs reviewed and stable and Patient moving all extremities X 4  Post vital signs: Reviewed and stable  Last Vitals:  Vitals:   04/17/16 0644  BP: (!) 196/81  Pulse: 64  Resp: 20  Temp: 36.7 C    Last Pain:  Vitals:   04/17/16 0644  TempSrc: Oral      Patients Stated Pain Goal: 2 (18/59/09 3112)  Complications: No apparent anesthesia complications

## 2016-04-17 NOTE — Anesthesia Postprocedure Evaluation (Signed)
Anesthesia Post Note  Patient: Travis Crawford  Procedure(s) Performed: Procedure(s) (LRB): LUMBAR LAMINECTOMY/DECOMPRESSION MICRODISCECTOMY 2 LEVELS (N/A)  Patient location during evaluation: PACU Anesthesia Type: General Level of consciousness: awake and alert Pain management: pain level controlled Vital Signs Assessment: post-procedure vital signs reviewed and stable Respiratory status: spontaneous breathing, nonlabored ventilation, respiratory function stable and patient connected to nasal cannula oxygen Cardiovascular status: blood pressure returned to baseline and stable Postop Assessment: no signs of nausea or vomiting Anesthetic complications: no       Last Vitals:  Vitals:   04/17/16 1211 04/17/16 1234  BP:  (!) 155/91  Pulse: (!) 58 66  Resp: 17 18  Temp: 36.2 C 36.6 C    Last Pain:  Vitals:   04/17/16 0644  TempSrc: Oral                 Andjela Wickes DAVID

## 2016-04-17 NOTE — Progress Notes (Signed)
Spoke with patient regarding CPAP. Patient states he wears one every night at home but does not think he will sleep here. Patient states he will call if he changes his mind and decides he wants one. RN aware and will call if patient needs one.

## 2016-04-17 NOTE — Brief Op Note (Signed)
04/17/2016  11:26 AM  PATIENT:  Travis Crawford  66 y.o. male  PRE-OPERATIVE DIAGNOSIS:  LUMBER SPINAL STENOSIS [name]  POST-OPERATIVE DIAGNOSIS:  LUMBER SPINAL STENOSIS [name]  PROCEDURE:  Procedure(s): LUMBAR LAMINECTOMY/DECOMPRESSION MICRODISCECTOMY 2 LEVELS (N/A)  SURGEON:  Surgeon(s) and Role:    * Melina Schools, MD - Primary  PHYSICIAN ASSISTANT:   ASSISTANTS: Carmen Mayo   ANESTHESIA:   general  EBL:  Total I/O In: 1000 [I.V.:1000] Out: 700 [Urine:300; Other:100; Blood:300]  BLOOD ADMINISTERED:none  DRAINS: none   LOCAL MEDICATIONS USED:  MARCAINE     SPECIMEN:  No Specimen  DISPOSITION OF SPECIMEN:  N/A  COUNTS:  YES  TOURNIQUET:  * No tourniquets in log *  DICTATION: .Other Dictation: Dictation Number [id number]  PLAN OF CARE: Admit to inpatient   PATIENT DISPOSITION:  PACU - hemodynamically stable.

## 2016-04-17 NOTE — Evaluation (Signed)
Physical Therapy Evaluation Patient Details Name: Travis Crawford MRN: [id number] DOB: 07-03-1950 Today's Date: 04/17/2016   History of Present Illness  Pt presents for [name] diskectomy with PMH; DM, HTN, sleep apnea  Clinical Impression  Pt admitted with above diagnosis. Pt currently with functional limitations due to the deficits listed below (see PT Problem List). Pt ambulated 120' with RW and min A, c/o pain in back and LLE.  Pt will benefit from skilled PT to increase their independence and safety with mobility to allow discharge to the venue listed below.       Follow Up Recommendations Home health PT    Equipment Recommendations  None recommended by PT    Recommendations for Other Services       Precautions / Restrictions Precautions Precautions: Back;Fall Precaution Booklet Issued: Yes (comment) Precaution Comments: reviewed precautions Required Braces or Orthoses: Spinal Brace (left in OR, RN called to get brought up) Spinal Brace: Lumbar corset Restrictions Weight Bearing Restrictions: No      Mobility  Bed Mobility Overal bed mobility: Needs Assistance Bed Mobility: Rolling;Sidelying to Sit Rolling: Supervision Sidelying to sit: Supervision       General bed mobility comments: supervision with vc's for precautions, increased time needed  Transfers Overall transfer level: Needs assistance Equipment used: None Transfers: Sit to/from Stand Sit to Stand: Min guard         General transfer comment: min-guard for safety, Pt unsteady, reaching for wall. Needed to go to the bathroom urgently so AD not used for short ambulation  Ambulation/Gait Ambulation/Gait assistance: Min assist Ambulation Distance (Feet): 120 Feet Assistive device: Rolling walker (2 wheeled) Gait Pattern/deviations: Step-through pattern;Antalgic Gait velocity: decreased Gait velocity interpretation: Below normal speed for age/gender General Gait Details: pt c/o pain in back and  LLE with ambulation. Began to feel that legs were weakn after 60'  Stairs            Wheelchair Mobility    Modified Rankin (Stroke Patients Only)       Balance Overall balance assessment: Needs assistance;History of Falls Sitting-balance support: No upper extremity supported;Feet supported Sitting balance-Leahy Scale: Good     Standing balance support: No upper extremity supported Standing balance-Leahy Scale: Fair                               Pertinent Vitals/Pain Pain Assessment: Faces Faces Pain Scale: Hurts even more Pain Location: back Pain Descriptors / Indicators: Aching Pain Intervention(s): Limited activity within patient's tolerance;Monitored during session;Premedicated before session    Home Living Family/patient expects to be discharged to:: Private residence Living Arrangements: Spouse/significant other Available Help at Discharge: Family;Available 24 hours/day Type of Home: Mobile home Home Access: Stairs to enter Entrance Stairs-Rails: Right Entrance Stairs-Number of Steps: 5 Home Layout: One level Home Equipment: Walker - 2 wheels;Bedside commode      Prior Function Level of Independence: Independent               Hand Dominance        Extremity/Trunk Assessment   Upper Extremity Assessment Upper Extremity Assessment: Overall WFL for tasks assessed    Lower Extremity Assessment Lower Extremity Assessment: Overall WFL for tasks assessed    Cervical / Trunk Assessment Cervical / Trunk Assessment: Normal  Communication   Communication: No difficulties  Cognition Arousal/Alertness: Awake/alert Behavior During Therapy: WFL for tasks assessed/performed Overall Cognitive Status: Within Functional Limits for tasks assessed  General Comments      Exercises     Assessment/Plan    PT Assessment Patient needs continued PT services  PT Problem List Decreased activity tolerance;Decreased  balance;Decreased mobility;Decreased knowledge of precautions;Pain       PT Treatment Interventions DME instruction;Gait training;Stair training;Functional mobility training;Therapeutic activities;Therapeutic exercise;Balance training;Patient/family education    PT Goals (Current goals can be found in the Care Plan section)  Acute Rehab PT Goals Patient Stated Goal: return home to take care of 86 yo grandson PT Goal Formulation: With patient Time For Goal Achievement: 04/24/16 Potential to Achieve Goals: Good    Frequency Min 5X/week   Barriers to discharge        Co-evaluation               End of Session   Activity Tolerance: Patient tolerated treatment well Patient left: in chair;with call bell/phone within reach Nurse Communication: Mobility status PT Visit Diagnosis: Unsteadiness on feet (R26.81);Pain Pain - Right/Left: Left Pain - part of body: Leg (back)         Time: 9735-3299 PT Time Calculation (min) (ACUTE ONLY): 27 min   Charges:         PT G Codes:       [place] 04/17/2016, 3:22 PM

## 2016-04-17 NOTE — H&P (Signed)
History of Present Illness  The patient is a 66 year old male who comes in today for a preoperative History and Physical. The patient is scheduled for a Lumbar Decompression [name] to be performed by Dr. Duane Lope D. Rolena Infante, MD at [facility] on 04/17/16 . Please see the hospital record for complete dictated history and physical. Pt has DM2. he is controlled on metformin. last A1c was 7.2.   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  Social History Non smoker / no tobacco use  Never smoker. Children  1 Current drinker  02/16/2016: Currently drinks hard liquor less than 5 times per week Current work status  working full time Living situation  live with spouse Marital status  married No history of drug/alcohol rehab  Not under pain contract   Medication History  OxyCODONE HCl (5MG  Tablet, Oral) Active. (prn Rx'd by PCP) Hydrocodone-Acetaminophen (7.5-325MG  Tablet, Oral) Active. MetFORMIN HCl (500MG  Tablet, Oral) Active. (bid) Potassium Chloride ER (20MEQ Tablet ER, Oral) Active. (qd) Atenolol-Chlorthalidone (50-25MG  Tablet, Oral) Active. (qd) Lovastatin (20MG  Tablet, Oral) Active. (qd) Medications Reconciled  Vitals  04/09/2016 1:17 PM Weight: 226 lb Height: 68in Body Surface Area: 2.15 m Body Mass Index: 34.36 kg/m  Temp.: 97.63F  Pulse: 92 (Regular)  BP: 146/86 (Sitting, Left Arm, Standard)  General General Appearance-Not in acute distress. Orientation-Oriented X3. Build & Nutrition-Well nourished and Well developed.  Integumentary General Characteristics Surgical Scars - no surgical scar evidence of previous lumbar surgery. Lumbar Spine-Skin examination of the lumbar spine is  without deformity, skin lesions, lacerations or abrasions.  Chest and Lung Exam Auscultation Breath sounds - Normal and Clear.  Cardiovascular Auscultation Rhythm - Regular rate and rhythm.  Abdomen Palpation/Percussion Palpation and Percussion of the abdomen reveal - Soft, Non Tender and No Rebound tenderness.  Peripheral Vascular Lower Extremity Palpation - Posterior tibial pulse - Bilateral - 1+. Dorsalis pedis pulse - Bilateral - 1+.  Neurologic Sensation Lower Extremity - Bilateral - sensation is diminished in the lower extremity. Reflexes Patellar Reflex - Bilateral - 2+. Achilles Reflex - Bilateral - 2+. Clonus - Bilateral - clonus not present. Hoffman's Sign - Bilateral - Hoffman's sign not present. Testing Seated Straight Leg Raise - Bilateral - Seated straight leg raise negative.  Musculoskeletal Spine/Ribs/Pelvis  Lumbosacral Spine: Inspection and Palpation - Tenderness - left lumbar paraspinals tender to palpation and right lumbar paraspinals tender to palpation. Strength and Tone: Strength - Hip Flexion - Bilateral - 5/5. Knee Extension - Bilateral - 5/5. Knee Flexion - Bilateral - 5/5. Ankle Dorsiflexion - Bilateral - 5/5. Ankle Plantarflexion - Bilateral - 5/5. Heel walk - Bilateral - able to heel walk without difficulty. Toe Walk - Bilateral - able to walk on toes without difficulty. Heel-Toe Walk - Bilateral - able to heel-toe walk without difficulty. ROM - Flexion - moderately decreased range of motion and painful. Extension - moderately decreased range of motion and painful. Left Lateral Bending - moderately decreased range of motion and painful. Right Lateral Bending - moderately decreased range of motion and painful. Right Rotation - moderately decreased range of motion and painful. Left Rotation - moderately decreased range of motion and painful. Pain - neither flexion or extension is more painful than the other. Lumbosacral Spine - Waddell's Signs - no Waddell's  signs  present. Lower Extremity Range of Motion - No true hip, knee or ankle pain with range of motion. Gait and Station - [employer] - no assistive devices.  His MRI clearly shows two level severe spinal stenosis at L4-L5 and L5-S1, hard osteophyte as well as facet arthrosis. No instability noted.   Assessment & Plan  Goal Of Surgery: Discussed that goal of surgery is to reduce pain and improve function and quality of life. Patient is aware that despite all appropriate treatment that there pain and function could be the same, worse, or different.  Posterior Lumbar Decompression/disectomy: Risks of surgery include infection, bleeding, nerve damage, death, stroke, paralysis, failure to heal, need for further surgery, ongoing or worse pain, need for further surgery, CSF leak, loss of bowel or bladder, and recurrent disc herniation or Stenosis which would necessitate need for further surgery.   At this point in time, I do think his principal problem is spinal stenosis with neurogenic claudication. He has had injection therapy and attempts of physical therapy and his quality of life is continue to diminish. At this point, we have discussed a two level lumbar decompression, which I think is the best treatment course. We reviewed the risks, benefits, goal and expectations. All of his and his wife's questions were addressed. Once we get preoperative clearance from his primary care physician we will go ahead and move forward with surgery.

## 2016-04-18 ENCOUNTER — Encounter (HOSPITAL_COMMUNITY): Payer: Self-pay | Admitting: Orthopedic Surgery

## 2016-04-18 DIAGNOSIS — I10 Essential (primary) hypertension: Secondary | ICD-10-CM | POA: Diagnosis not present

## 2016-04-18 DIAGNOSIS — Z79899 Other long term (current) drug therapy: Secondary | ICD-10-CM | POA: Diagnosis not present

## 2016-04-18 DIAGNOSIS — Z87891 Personal history of nicotine dependence: Secondary | ICD-10-CM | POA: Diagnosis not present

## 2016-04-18 DIAGNOSIS — M48062 Spinal stenosis, lumbar region with neurogenic claudication: Secondary | ICD-10-CM | POA: Diagnosis not present

## 2016-04-18 DIAGNOSIS — M5442 Lumbago with sciatica, left side: Secondary | ICD-10-CM | POA: Diagnosis not present

## 2016-04-18 DIAGNOSIS — E119 Type 2 diabetes mellitus without complications: Secondary | ICD-10-CM | POA: Diagnosis not present

## 2016-04-18 DIAGNOSIS — G4730 Sleep apnea, unspecified: Secondary | ICD-10-CM | POA: Diagnosis not present

## 2016-04-18 DIAGNOSIS — Z7984 Long term (current) use of oral hypoglycemic drugs: Secondary | ICD-10-CM | POA: Diagnosis not present

## 2016-04-18 LAB — HEMOGLOBIN A1C
Hgb A1c MFr Bld: 7 % — ABNORMAL HIGH (ref 4.8–5.6)
Mean Plasma Glucose: 154 mg/dL

## 2016-04-18 LAB — GLUCOSE, CAPILLARY
Glucose-Capillary: 175 mg/dL — ABNORMAL HIGH (ref 65–99)
Glucose-Capillary: 163 mg/dL — ABNORMAL HIGH (ref 65–99)
Glucose-Capillary: 201 mg/dL — ABNORMAL HIGH (ref 65–99)

## 2016-04-18 MED ORDER — OXYCODONE HCL ER 10 MG PO T12A
10.0000 mg | EXTENDED_RELEASE_TABLET | Freq: Two times a day (BID) | ORAL | Status: DC
  Administered 2016-04-18 – 2016-04-19 (×3): 10 mg via ORAL
  Filled 2016-04-18 (×3): qty 1

## 2016-04-18 MED ORDER — MAGNESIUM CITRATE PO SOLN
1.0000 | Freq: Once | ORAL | Status: AC
  Administered 2016-04-18: 1 via ORAL
  Filled 2016-04-18: qty 296

## 2016-04-18 MED ORDER — FLEET ENEMA 7-19 GM/118ML RE ENEM: 1.0000 | ENEMA | Freq: Every day | RECTAL | Status: DC | PRN

## 2016-04-18 NOTE — Care Management Obs Status (Signed)
[place] NOTIFICATION   Patient Details  Name: Travis Crawford MRN: [id number] Date of Birth: Sep 06, 1950   Medicare Observation Status Notification Given:  Yes    Ninfa Meeker, RN 04/18/2016, 2:13 PM

## 2016-04-18 NOTE — Care Management Note (Signed)
Case Management Note  Patient Details  Name: Travis Crawford MRN: [id number] Date of Birth: 1950/03/22  Subjective/Objective:   66 yr old gentleman s/p Lumbar laminectomy/decompression microdiscectomy 2 levels.                  Action/Plan: Case manager spoke with patient concerning Home Health needs. Choice for Home Health Agency was offered. Referral was called to Cleotis Nipper, Liaison for [facility]. Patient states he will have family assistance at discharge.    Expected Discharge Date:   04/19/16               Expected Discharge Plan:  [place]  In-House Referral:     Discharge planning Services  CM Consult  Post Acute Care Choice:  Home Health Choice offered to:  Patient  DME Arranged:  N/A DME Agency:  NA  HH Arranged:  PT, OT HH Agency:  [facility]  Status of Service:  Completed, signed off  If discussed at [place] of Stay Meetings, dates discussed:    Additional Comments:  Ninfa Meeker, RN 04/18/2016, 2:22 PM

## 2016-04-18 NOTE — Care Management CC44 (Signed)
Condition Code 44 Documentation Completed  Patient Details  Name: Luby Seamans MRN: [id number] Date of Birth: Feb 23, 1950   Condition Code 44 given:  Yes Patient signature on Condition Code 44 notice:  Yes Documentation of 2 MD's agreement:  Yes Code 44 added to claim:  Yes    Ninfa Meeker, RN 04/18/2016, 2:13 PM

## 2016-04-18 NOTE — Progress Notes (Signed)
Physical Therapy Treatment Patient Details Name: Travis Crawford MRN: [id number] DOB: 1950-10-18 Today's Date: 04/18/2016    History of Present Illness Pt presents for [name] diskectomy with PMH; DM, HTN, sleep apnea    PT Comments    Pt progressing slowly towards physical therapy goals. Limited this session due to dizziness and we were not able to progress ambulation distance or attempt stair training. BP at end of session was 138/69 and RN was present. Will continue to follow and progress as able per POC.   Follow Up Recommendations  Home health PT     Equipment Recommendations  None recommended by PT    Recommendations for Other Services       Precautions / Restrictions Precautions Precautions: Back;Fall Precaution Booklet Issued: Yes (comment) Precaution Comments: Pt unable to recall any precautions. Reviewed handout however pt talked over therapist almost the entire attempt at education. Don't feel it was effective.  Required Braces or Orthoses: Spinal Brace Spinal Brace: Lumbar corset Restrictions Weight Bearing Restrictions: No    Mobility  Bed Mobility               General bed mobility comments: Pt sitting up on EOB when PT arrived. He was complaining of dizziness after using the bathroom.   Transfers Overall transfer level: Needs assistance Equipment used: Rolling walker (2 wheeled) Transfers: Sit to/from Stand Sit to Stand: Min guard         General transfer comment: Close guard for safety as pt powered-up to full standing position. VC's for maintenance of precautions however pt did not make corrective changes.   Ambulation/Gait Ambulation/Gait assistance: Min guard Ambulation Distance (Feet): 10 Feet Assistive device: Rolling walker (2 wheeled) Gait Pattern/deviations: Step-through pattern;Decreased stride length;Antalgic Gait velocity: decreased Gait velocity interpretation: Below normal speed for age/gender General Gait Details: Pt did  not feel he could ambulate out into the hall, however was willing to walk around the bed to the chair. he was able to step around the bed with and without the walker. Hands-on guarding provided for safety.    Stairs            Wheelchair Mobility    Modified Rankin (Stroke Patients Only)       Balance Overall balance assessment: Needs assistance Sitting-balance support: Feet supported Sitting balance-Leahy Scale: Fair     Standing balance support: Bilateral upper extremity supported Standing balance-Leahy Scale: Poor Standing balance comment: RW for support                    Cognition Arousal/Alertness: Awake/alert Behavior During Therapy: WFL for tasks assessed/performed Overall Cognitive Status: Within Functional Limits for tasks assessed                      Exercises      General Comments        Pertinent Vitals/Pain Pain Assessment: Faces Faces Pain Scale: Hurts whole lot Pain Location: back, buttocks Pain Descriptors / Indicators: Aching;Grimacing;Guarding Pain Intervention(s): Limited activity within patient's tolerance;Monitored during session;Repositioned    Home Living Family/patient expects to be discharged to:: Private residence Living Arrangements: Spouse/significant other Available Help at Discharge: Family;Available 24 hours/day Type of Home: Mobile home Home Access: Stairs to enter Entrance Stairs-Rails: Right Home Layout: One level Home Equipment: Walker - 2 wheels;Bedside commode;Shower seat      Prior Function Level of Independence: Independent          PT Goals (current goals can now be found in  the care plan section) Acute Rehab PT Goals Patient Stated Goal: return home to take care of 17 yo grandson PT Goal Formulation: With patient Time For Goal Achievement: 04/24/16 Potential to Achieve Goals: Good Progress towards PT goals: Progressing toward goals    Frequency    Min 5X/week      PT Plan Current  plan remains appropriate    Co-evaluation             End of Session Equipment Utilized During Treatment: Back brace Activity Tolerance: Patient tolerated treatment well Patient left: in chair;with call bell/phone within reach Nurse Communication: Mobility status PT Visit Diagnosis: Unsteadiness on feet (R26.81);Pain Pain - Right/Left: Left Pain - part of body:  (back)     Time: 5747-3403 PT Time Calculation (min) (ACUTE ONLY): 15 min  Charges:  $Gait Training: 8-22 mins                    G Codes:       Thelma Comp 04/23/16, 9:53 AM   Rolinda Roan, PT, DPT Acute Rehabilitation Services Pager: [phone]

## 2016-04-18 NOTE — Progress Notes (Signed)
    Subjective: 1 Day Post-Op Procedure(s) (LRB): LUMBAR LAMINECTOMY/DECOMPRESSION MICRODISCECTOMY 2 LEVELS (N/A) Patient reports pain as 10 on 0-10 scale.   Denies CP or SOB.  Voiding without difficulty. Positive flatus.  Pt reports he has been ambulating in the hallway. Nurse reports he has been giving him pain medication around the clock.  Objective: Vital signs in last 24 hours: Temp:  [97.2 F (36.2 C)-98.4 F (36.9 C)] 98 F (36.7 C) (03/15 0326) Pulse Rate:  [57-88] 88 (03/15 0326) Resp:  [12-21] 19 (03/15 0326) BP: (115-157)/(58-91) 125/58 (03/15 0326) SpO2:  [95 %-100 %] 98 % (03/15 0326)  Intake/Output from previous day: 03/14 0701 - 03/15 0700 In: 2520 [P.O.:720; I.V.:1800] Out: 700 [Urine:300; Blood:300] Intake/Output this shift: No intake/output data recorded.  Labs: No results for input(s): HGB in the last 72 hours. No results for input(s): WBC, RBC, HCT, PLT in the last 72 hours. No results for input(s): NA, K, CL, CO2, BUN, CREATININE, GLUCOSE, CALCIUM in the last 72 hours. No results for input(s): LABPT, INR in the last 72 hours.  Physical Exam: Neurologically intact ABD soft Sensation intact distally Dorsiflexion/Plantar flexion intact Incision: no drainage Compartment soft  Assessment/Plan: 1 Day Post-Op Procedure(s) (LRB): LUMBAR LAMINECTOMY/DECOMPRESSION MICRODISCECTOMY 2 LEVELS (N/A) Advance diet Up with therapy  Added extended release pain medication  [place], Darla Lesches for Dr. Melina Schools [facility] [phone] 04/18/2016, 7:18 AM    Patient ID: Travis Crawford, male   DOB: 17-Feb-1950, 66 y.o.   MRN: [id number]

## 2016-04-18 NOTE — Progress Notes (Signed)
Physical Therapy Treatment Patient Details Name: Travis Crawford MRN: [id number] DOB: [birth year] Today's Date: 04/18/2016    History of Present Illness Pt presents for [name] diskectomy with PMH; DM, HTN, sleep apnea    PT Comments    PT returned for second session today due to limitations this morning. Pt reports no dizziness throughout session and was able to improve ambulation distance. Pain increased at end of session and pt appeared to have a difficult time getting comfortable in the chair, but reports that pain is improved with stretching the LLE out instead of having the L foot flat on the floor. Will continue to follow and progress as able per POC.    Follow Up Recommendations  Home health PT     Equipment Recommendations  None recommended by PT    Recommendations for Other Services       Precautions / Restrictions Precautions Precautions: Back;Fall Precaution Booklet Issued: Yes (comment) Precaution Comments: Reviewed precautions during functional mobility Required Braces or Orthoses: Spinal Brace Spinal Brace: Lumbar corset Restrictions Weight Bearing Restrictions: No    Mobility  Bed Mobility               General bed mobility comments: Pt received in bathroom  Transfers Overall transfer level: Needs assistance Equipment used: Rolling walker (2 wheeled) Transfers: Sit to/from Stand Sit to Stand: Min guard         General transfer comment: Close guard for safety as pt powered-up to full standing position. VC's for maintenance of precautions however pt did not make corrective changes.   Ambulation/Gait Ambulation/Gait assistance: Min guard Ambulation Distance (Feet): 350 Feet Assistive device: Rolling walker (2 wheeled) Gait Pattern/deviations: Step-through pattern;Decreased stride length;Antalgic Gait velocity: decreased Gait velocity interpretation: Below normal speed for age/gender General Gait Details: VC's for improved posture and  general safety with walker positioning. Generally slow and guarded due to pain.    Stairs            Wheelchair Mobility    Modified Rankin (Stroke Patients Only)       Balance Overall balance assessment: Needs assistance Sitting-balance support: Feet supported Sitting balance-Leahy Scale: Fair     Standing balance support: Bilateral upper extremity supported Standing balance-Leahy Scale: Poor Standing balance comment: RW for support                    Cognition Arousal/Alertness: Awake/alert Behavior During Therapy: WFL for tasks assessed/performed Overall Cognitive Status: Within Functional Limits for tasks assessed                      Exercises      General Comments        Pertinent Vitals/Pain Pain Assessment: Faces Faces Pain Scale: Hurts whole lot Pain Location: back, buttocks Pain Descriptors / Indicators: Aching;Grimacing;Guarding Pain Intervention(s): Limited activity within patient's tolerance    Home Living                      Prior Function            PT Goals (current goals can now be found in the care plan section) Acute Rehab PT Goals PT Goal Formulation: With patient Time For Goal Achievement: 04/24/16 Potential to Achieve Goals: Good Progress towards PT goals: Progressing toward goals    Frequency    Min 5X/week      PT Plan Current plan remains appropriate    Co-evaluation  End of Session Equipment Utilized During Treatment: Back brace Activity Tolerance: Patient tolerated treatment well Patient left: in chair;with call bell/phone within reach Nurse Communication: Mobility status PT Visit Diagnosis: Unsteadiness on feet (R26.81);Pain Pain - Right/Left: Left Pain - part of body:  (Back)     Time: 1017-5102 PT Time Calculation (min) (ACUTE ONLY): 22 min  Charges:  $Gait Training: 8-22 mins                    G Codes:       Thelma Comp 05/14/2016, 5:07 PM  Rolinda Roan, PT, DPT Acute Rehabilitation Services Pager: [phone]

## 2016-04-18 NOTE — Evaluation (Signed)
Occupational Therapy Evaluation Patient Details Name: Travis Crawford MRN: [id number] DOB: 1950/03/14 Today's Date: 04/18/2016    History of Present Illness Pt presents for [name] diskectomy with PMH; DM, HTN, sleep apnea   Clinical Impression   Pt reports he was independent with ADL PTA. Currently pt overall min assist for ADL and min guard for functional mobility. Began back, safety, and ADL education with pt. Pt planning to d/c home with 24/7 supervision from family. Recommending HHOT for follow up to maximize independence and safety with ADL and functional mobility upon return home. Pt would benefit from continued skilled OT to address established goals.    Follow Up Recommendations  Home health OT;Supervision/Assistance - 24 hour    Equipment Recommendations  None recommended by OT    Recommendations for Other Services       Precautions / Restrictions Precautions Precautions: Back;Fall Precaution Booklet Issued: No Precaution Comments: Pt able to recall 2/3 precautions; reviewed all precautions with pt. Required Braces or Orthoses: Spinal Brace Spinal Brace: Lumbar corset Restrictions Weight Bearing Restrictions: No      Mobility Bed Mobility               General bed mobility comments: Pt OOB in chair upon arrival  Transfers Overall transfer level: Needs assistance Equipment used: Rolling walker (2 wheeled) Transfers: Sit to/from Stand Sit to Stand: Min assist         General transfer comment: Min assist to boost up from chair with increased time. Cues for sequencing and hand placement    Balance Overall balance assessment: Needs assistance Sitting-balance support: Feet supported Sitting balance-Leahy Scale: Fair     Standing balance support: Bilateral upper extremity supported Standing balance-Leahy Scale: Poor Standing balance comment: RW for support                            ADL Overall ADL's : Needs  assistance/impaired Eating/Feeding: Independent;Sitting   Grooming: Min guard;Standing Grooming Details (indicate cue type and reason): Educated on use of 2 cups for oral care Upper Body Bathing: Min guard;Sitting   Lower Body Bathing: Minimal assistance;Sit to/from stand   Upper Body Dressing : Min guard;Sitting   Lower Body Dressing: Minimal assistance;Sit to/from stand Lower Body Dressing Details (indicate cue type and reason): Pt unable to cross foot over opposite knee; pt unable. Pt reports wife can assist with ADL as needed Toilet Transfer: Min guard;Ambulation;BSC;RW Toilet Transfer Details (indicate cue type and reason): Simulated by sit to stand from chair with functional mobility   Toileting - Clothing Manipulation Details (indicate cue type and reason): Educated on no twisting during peri care and use of wet wipes   Tub/Shower Transfer Details (indicate cue type and reason): Educated on use of shower chair  Functional mobility during ADLs: Minimal assistance;Rolling walker General ADL Comments: Educated pt on maintaining back precautions during functional activities, keeping frequently used items at counter top height, frequent mobility throughout the day upon return home.     Vision         Perception     Praxis      Pertinent Vitals/Pain Pain Assessment: Faces Faces Pain Scale: Hurts whole lot Pain Location: back, buttocks Pain Descriptors / Indicators: Aching;Grimacing;Guarding Pain Intervention(s): Limited activity within patient's tolerance;Monitored during session;Repositioned;Patient requesting pain meds-RN notified     Hand Dominance     Extremity/Trunk Assessment Upper Extremity Assessment Upper Extremity Assessment: Overall WFL for tasks assessed   Lower Extremity Assessment  Lower Extremity Assessment: Defer to PT evaluation   Cervical / Trunk Assessment Cervical / Trunk Assessment: Other exceptions Cervical / Trunk Exceptions: s/p spinal sx    Communication Communication Communication: No difficulties   Cognition Arousal/Alertness: Awake/alert Behavior During Therapy: WFL for tasks assessed/performed Overall Cognitive Status: Within Functional Limits for tasks assessed                     General Comments       Exercises       Shoulder Instructions      Home Living Family/patient expects to be discharged to:: Private residence Living Arrangements: Spouse/significant other Available Help at Discharge: Family;Available 24 hours/day Type of Home: Mobile home Home Access: Stairs to enter Entrance Stairs-Number of Steps: 5 Entrance Stairs-Rails: Right Home Layout: One level     Bathroom Shower/Tub: Tub/shower unit Shower/tub characteristics: Curtain Biochemist, clinical: Standard     Home Equipment: Environmental consultant - 2 wheels;Bedside commode;Shower seat          Prior Functioning/Environment Level of Independence: Independent                 OT Problem List: Decreased strength;Impaired balance (sitting and/or standing);Decreased knowledge of use of DME or AE;Decreased knowledge of precautions;Obesity;Pain      OT Treatment/Interventions: Self-care/ADL training;Energy conservation;DME and/or AE instruction;Therapeutic activities;Patient/family education;Balance training    OT Goals(Current goals can be found in the care plan section) Acute Rehab OT Goals Patient Stated Goal: return home to take care of 13 yo grandson OT Goal Formulation: With patient Time For Goal Achievement: 05/02/16 Potential to Achieve Goals: Good ADL Goals Pt Will Transfer to Toilet: with supervision;ambulating;bedside commode Pt Will Perform Toileting - Clothing Manipulation and hygiene: with supervision;sit to/from stand Pt Will Perform Tub/Shower Transfer: Tub transfer;with supervision;ambulating;shower seat;rolling walker Additional ADL Goal #1: Pt will don/doff back brace with set up as precursor to ADL and functional  mobility. Additional ADL Goal #2: Pt will perform log roll for bed mobility at mod I level without verbal cues.  OT Frequency: Min 2X/week   Barriers to D/C:            Co-evaluation              End of Session Equipment Utilized During Treatment: Rolling walker;Back brace Nurse Communication: Mobility status;Patient requests pain meds  Activity Tolerance: Patient tolerated treatment well;Patient limited by pain Patient left: in chair;with call bell/phone within reach  OT Visit Diagnosis: Unsteadiness on feet (R26.81)                ADL either performed or assessed with clinical judgement  Time: 1610-9604 OT Time Calculation (min): 25 min Charges:  OT General Charges $OT Visit: 1 Procedure OT Evaluation $OT Eval Moderate Complexity: 1 Procedure OT Treatments $Self Care/Home Management : 8-22 mins G-Codes:     Mel Almond A. Ulice Brilliant, M.S., OTR/L Pager: [place] 04/18/2016, 9:08 AM

## 2016-04-18 NOTE — Progress Notes (Signed)
Patient is refusing the use of CPAP for tonight. RT informed patient if he changes his mind have RN contact RT. 

## 2016-04-19 DIAGNOSIS — E119 Type 2 diabetes mellitus without complications: Secondary | ICD-10-CM | POA: Diagnosis not present

## 2016-04-19 DIAGNOSIS — I10 Essential (primary) hypertension: Secondary | ICD-10-CM | POA: Diagnosis not present

## 2016-04-19 DIAGNOSIS — G4730 Sleep apnea, unspecified: Secondary | ICD-10-CM | POA: Diagnosis not present

## 2016-04-19 DIAGNOSIS — Z87891 Personal history of nicotine dependence: Secondary | ICD-10-CM | POA: Diagnosis not present

## 2016-04-19 DIAGNOSIS — Z79899 Other long term (current) drug therapy: Secondary | ICD-10-CM | POA: Diagnosis not present

## 2016-04-19 DIAGNOSIS — Z7984 Long term (current) use of oral hypoglycemic drugs: Secondary | ICD-10-CM | POA: Diagnosis not present

## 2016-04-19 DIAGNOSIS — M5442 Lumbago with sciatica, left side: Secondary | ICD-10-CM | POA: Diagnosis not present

## 2016-04-19 DIAGNOSIS — M48062 Spinal stenosis, lumbar region with neurogenic claudication: Secondary | ICD-10-CM | POA: Diagnosis not present

## 2016-04-19 LAB — GLUCOSE, CAPILLARY
Glucose-Capillary: 131 mg/dL — ABNORMAL HIGH (ref 65–99)
Glucose-Capillary: 134 mg/dL — ABNORMAL HIGH (ref 65–99)

## 2016-04-19 NOTE — Progress Notes (Signed)
Occupational Therapy Treatment Patient Details Name: Travis Crawford MRN: [id number] DOB: [birth year] Today's Date: 04/19/2016    History of present illness Pt presents for [name] diskectomy with PMH; DM, HTN, sleep apnea   OT comments  Pt plans to sponge bath upon d/c home and will have wife (A) for dressing per patient. Pt continues to be limited in therapy by pain even premediated. Pt fixated on need to void and reports that sit<>stand causes incontinence of bowel that was not present before. Recommend HHOT to help with follow up at home level .   Follow Up Recommendations  Home health OT;Supervision/Assistance - 24 hour    Equipment Recommendations  None recommended by OT    Recommendations for Other Services      Precautions / Restrictions Precautions Precautions: Back;Fall Precaution Comments: Reviewed precautions during functional task Required Braces or Orthoses: Spinal Brace Spinal Brace: Lumbar corset       Mobility Bed Mobility               General bed mobility comments: in bathroom on arrval  Transfers Overall transfer level: Needs assistance Equipment used: Rolling walker (2 wheeled) Transfers: Sit to/from Stand Sit to Stand: Min guard         General transfer comment: pt heavy reliance on arm rest to push into standing and RW    Balance Overall balance assessment: Needs assistance   Sitting balance-Leahy Scale: Fair       Standing balance-Leahy Scale: Poor Standing balance comment: RW for support                   ADL Overall ADL's : Needs assistance/impaired             Lower Body Bathing: Moderate assistance   Upper Body Dressing : Minimal assistance   Lower Body Dressing: Maximal assistance Lower Body Dressing Details (indicate cue type and reason): pt plans to have wife (A) and educated on dressing L LE first due to incr difficulty lifting Toilet Transfer: Min Psychiatric nurse Details (indicate cue type and  reason): cues for safety         Functional mobility during ADLs: Minimal assistance General ADL Comments: pt reaching for environmental supports even with RW. pt reports that pain is greatly limiting and fixated on voiding bowels. pt needs encouragement to allow staff to help at times. pt dressed during this session. pt reports severe pain requiring standing but when PT arrived to address standing pt reports that seated is best position and reduction in pain immediately. pt very inconsistent in pain rating Pt educated on bathing and avoid washing directly on incision. Pt educated to use new wash cloth and towel each day. Pt educated to allow water to run across dressing and not to soak in a tub at this time. Pt advised RN will instruct on any bandages required otherwise is open to air.         Vision                     Perception     Praxis      Cognition   Behavior During Therapy: [facility] for tasks assessed/performed Overall Cognitive Status: Within Functional Limits for tasks assessed                         Exercises     Shoulder Instructions       General Comments  Pertinent Vitals/ Pain       Pain Assessment: Faces Pain Score: 8  Pain Location: back, buttocks Pain Descriptors / Indicators: Aching;Grimacing;Guarding;Shooting;Constant Pain Intervention(s): Limited activity within patient's tolerance;Monitored during session;Premedicated before session;Repositioned  Home Living                                          Prior Functioning/Environment              Frequency  Min 2X/week        Progress Toward Goals  OT Goals(current goals can now be found in the care plan section)  Progress towards OT goals: Progressing toward goals  Acute Rehab OT Goals Patient Stated Goal: return home to take care of 68 yo grandson OT Goal Formulation: With patient Time For Goal Achievement: 05/02/16 Potential to Achieve Goals:  Good ADL Goals Pt Will Transfer to Toilet: with supervision;ambulating;bedside commode Pt Will Perform Toileting - Clothing Manipulation and hygiene: with supervision;sit to/from stand Pt Will Perform Tub/Shower Transfer: Tub transfer;with supervision;ambulating;shower seat;rolling walker Additional ADL Goal #1: Pt will don/doff back brace with set up as precursor to ADL and functional mobility. Additional ADL Goal #2: Pt will perform log roll for bed mobility at mod I level without verbal cues.  Plan Discharge plan remains appropriate    Co-evaluation                 End of Session Equipment Utilized During Treatment: Rolling walker;Back brace  OT Visit Diagnosis: Unsteadiness on feet (R26.81)   Activity Tolerance Patient tolerated treatment well;Patient limited by pain   Patient Left in chair;with call bell/phone within reach   Nurse Communication Mobility status;Patient requests pain meds        Time: [phone] OT Time Calculation (min): 22 min  Charges: OT General Charges $OT Visit: 1 Procedure OT Treatments $Self Care/Home Management : 8-22 mins   Jeri Modena   OTR/L Pager: [phone] Office: [phone] .    Parke Poisson B 04/19/2016, 10:30 AM

## 2016-04-19 NOTE — Op Note (Signed)
NAME:  Adamcik, Helane Rima NO.:  MEDICAL RECORD NO.:  [id number]  LOCATION:                                 FACILITY:  PHYSICIAN:  Jamela Cumbo D. Rolena Infante, M.D.      DATE OF BIRTH:  DATE OF PROCEDURE:  04/17/2016 DATE OF DISCHARGE:                              OPERATIVE REPORT   PREOPERATIVE DIAGNOSIS:  Lumbar spinal stenosis with neurogenic claudication, right side worse than the left.  POSTOPERATIVE DIAGNOSIS:  Lumbar spinal stenosis with neurogenic claudication, right side worse than the left.  OPERATIVE PROCEDURE:  L4 to S1 lumbar decompression, foraminotomy, facetectomy.  COMPLICATIONS:  None.  CONDITION:  Stable.  HISTORY:  This is a very pleasant 66 year old gentleman who presents with significant progressive back, buttock, and bilateral neuropathic leg pain, right worse than the left.  Attempts at conservative management had failed, then we elected to proceed with surgery.  All appropriate risks, benefits, and alternatives were discussed and consent was obtained.  FIRST ASSIST:  Ronette Deter, my PA.  OPERATIVE NOTE:  The patient was brought to the operating room and placed supine on the operating table.  After successful induction of general anesthesia and endotracheal intubation, TEDs, SCDs, and Foley were inserted.  The patient was turned prone onto the Wilson frame and all bony prominences were well padded, and the back was prepped and draped in a standard fashion.  Time-out was taken confirming patient, procedure, and all other pertinent important data.  Once that was completed, 2 needles were placed into the back and x-ray was taken for localization.  I then marked out the incision site and infiltrated with 0.25% Marcaine with epi.  A midline incision was made, and sharp dissection was carried down to the deep fascia.  I incised the deep fascia, and using Bovie and Cobb, I stripped the paraspinal muscles to expose the L4, L5, and a portion of  the S1 spinous process and lamina. Self-retaining retractor was placed and a Penfield 4 was placed beneath the lamina of L5.  A second x-ray was taken confirming that I was at the L5 lamina.  A double-action Leksell rongeur was used to remove the posterior spinous process of L5 and the majority that of L4.  I then used my neuro curette to develop a plane underneath the L5 lamina, and using Kerrison punches, I performed a laminectomy of L5.  Care was taken using neuro patties to protect the underlying thecal sac.  I then continued my central decompression superiorly and performed a generous laminotomy of L4.  I removed all of the very thickened ligamentum flavum and the significant epidural fat, both of which were contributing to his stenosis.  Once the central decompression was completed, I then worked into the lateral recess, resecting down into the thickened ligamentum flavum and the medial facet bone spurs.  I identified the L4 pedicle and then undercut the foramen to allow relief of compression in the foramen.  I continued my decompression using the 2 mm and 3 mm punch inferiorly again identifying the L5 and S1 pedicles and performing foraminotomies as well.  With once I complete, I then went to the contralateral side,  repeated the posterior lateral recess decompression and foraminotomies. At this point, I then exposed the disk space of 4-5 and 5-1, coagulated the epidural veins with bipolar electrocautery.  There was significant hard osteophyte disk material at both levels.  There was no soft tissue disk herniation that could be excised.  With the posterior elements decompressed, the nerves were no longer under any significant compression, and so I elected to complete the procedure.  With the decompression adequately performed, I irrigated the wound copiously with normal saline and then used bipolar electrocautery as well as FloSeal to obtain and maintain hemostasis.  I closed the  deep fascia with interrupted #1 Vicryl suture, superficial with 2-0 Vicryl suture, and a 3-0 Monocryl for the skin.  Steri-Strips and dry dressing were applied, and the patient was ultimately extubated, transferred to the PACU without incident.  At the end of the case, all needle and sponge counts were correct.  There were no adverse intraoperative events.     Marypat Kimmet D. Rolena Infante, M.D.     DDB/MEDQ  D:  04/17/2016  T:  04/17/2016  Job:  [id number]

## 2016-04-19 NOTE — Progress Notes (Signed)
Patient alert and oriented, mae's well, voiding adequate amount of urine, swallowing without difficulty, no c/o pain. Patient discharged home with family. Script and discharged instructions given to patient. Patient and family stated understanding of d/c instructions given and has an appointment with Dr Brooks  

## 2016-04-19 NOTE — Progress Notes (Signed)
Physical Therapy Treatment Patient Details Name: Travis Crawford MRN: [id number] DOB: 28-Dec-1950 Today's Date: 04/19/2016    History of Present Illness Pt presents for [name] diskectomy with PMH; DM, HTN, sleep apnea    PT Comments    Pt was able to perform transfers and ambulation with min guard assist for balance support and safety, and stair training with min assist. Pt continues to require increased cues for maintenance of precautions, as well as reasoning of why he is currently in pain. Will continue to follow.    Follow Up Recommendations  Home health PT     Equipment Recommendations  None recommended by PT    Recommendations for Other Services       Precautions / Restrictions Precautions Precautions: Back;Fall Precaution Booklet Issued: Yes (comment) Precaution Comments: Reviewed precautions during functional task Required Braces or Orthoses: Spinal Brace Spinal Brace: Lumbar corset Restrictions Weight Bearing Restrictions: No    Mobility  Bed Mobility               General bed mobility comments: Pt sitting in recliner upon PT arrival  Transfers Overall transfer level: Needs assistance Equipment used: Rolling walker (2 wheeled) Transfers: Sit to/from Stand Sit to Stand: Min guard         General transfer comment: pt heavy reliance on arm rest to push into standing and RW  Ambulation/Gait Ambulation/Gait assistance: Min guard Ambulation Distance (Feet): 350 Feet Assistive device: Rolling walker (2 wheeled) Gait Pattern/deviations: Step-through pattern;Decreased stride length;Antalgic Gait velocity: decreased Gait velocity interpretation: Below normal speed for age/gender General Gait Details: VC's for improved posture and general safety with walker positioning. Generally slow and guarded due to pain.    Stairs Stairs: Yes   Stair Management: Two rails;Step to pattern;Forwards Number of Stairs: 4 General stair comments: VC's for  sequencing and safety. Min assist for balance support.   Wheelchair Mobility    Modified Rankin (Stroke Patients Only)       Balance Overall balance assessment: Needs assistance Sitting-balance support: Feet supported Sitting balance-Leahy Scale: Fair     Standing balance support: Bilateral upper extremity supported Standing balance-Leahy Scale: Poor Standing balance comment: RW for support                    Cognition Arousal/Alertness: Awake/alert Behavior During Therapy: WFL for tasks assessed/performed Overall Cognitive Status: Within Functional Limits for tasks assessed                      Exercises      General Comments        Pertinent Vitals/Pain Pain Assessment: Faces Faces Pain Scale: Hurts whole lot Pain Location: back, buttocks at end of session Pain Descriptors / Indicators: Aching;Grimacing;Guarding;Shooting;Constant    Home Living                      Prior Function            PT Goals (current goals can now be found in the care plan section) Acute Rehab PT Goals PT Goal Formulation: With patient Time For Goal Achievement: 04/24/16 Potential to Achieve Goals: Good Progress towards PT goals: Progressing toward goals    Frequency    Min 5X/week      PT Plan Current plan remains appropriate    Co-evaluation             End of Session Equipment Utilized During Treatment: Back brace Activity Tolerance: Patient tolerated treatment well  Patient left: in chair;with call bell/phone within reach Nurse Communication: Mobility status PT Visit Diagnosis: Unsteadiness on feet (R26.81);Pain Pain - Right/Left: Left Pain - part of body:  (Back)     Time: 0933-1000 PT Time Calculation (min) (ACUTE ONLY): 27 min  Charges:  $Gait Training: 23-37 mins                    G Codes:       Thelma Comp 04-30-2016, 3:12 PM   Rolinda Roan, PT, DPT Acute Rehabilitation Services Pager: [phone]

## 2016-04-19 NOTE — Progress Notes (Signed)
    Subjective: Procedure(s) (LRB): LUMBAR LAMINECTOMY/DECOMPRESSION MICRODISCECTOMY 2 LEVELS (N/A) 2 Days Post-Op  Patient reports pain as 2 on 0-10 scale.  Reports decreased leg pain reports incisional back pain   Positive void Positive bowel movement Positive flatus Negative chest pain or shortness of breath  Objective: Vital signs in last 24 hours: Temp:  [97.8 F (36.6 C)-99.5 F (37.5 C)] 98.1 F (36.7 C) (03/16 0320) Pulse Rate:  [68-80] 79 (03/16 0320) Resp:  [18] 18 (03/16 0320) BP: (117-149)/(60-74) 136/71 (03/16 0320) SpO2:  [94 %-100 %] 94 % (03/16 0320)  Intake/Output from previous day: 03/15 0701 - 03/16 0700 In: 120 [P.O.:120] Out: -   Labs: No results for input(s): WBC, RBC, HCT, PLT in the last 72 hours. No results for input(s): NA, K, CL, CO2, BUN, CREATININE, GLUCOSE, CALCIUM in the last 72 hours. No results for input(s): LABPT, INR in the last 72 hours.  Physical Exam: Neurologically intact ABD soft Intact pulses distally Incision: dressing C/D/I Compartment soft  Assessment/Plan: Patient stable  xrays n/a Continue mobilization with physical therapy Continue care  Advance diet Up with therapy  Plan on d/c to home  Melina Schools, MD [place] [phone]

## 2016-04-20 DIAGNOSIS — E119 Type 2 diabetes mellitus without complications: Secondary | ICD-10-CM | POA: Diagnosis not present

## 2016-04-20 DIAGNOSIS — I10 Essential (primary) hypertension: Secondary | ICD-10-CM | POA: Diagnosis not present

## 2016-04-20 DIAGNOSIS — Z7984 Long term (current) use of oral hypoglycemic drugs: Secondary | ICD-10-CM | POA: Diagnosis not present

## 2016-04-20 DIAGNOSIS — K219 Gastro-esophageal reflux disease without esophagitis: Secondary | ICD-10-CM | POA: Diagnosis not present

## 2016-04-20 DIAGNOSIS — Z4789 Encounter for other orthopedic aftercare: Secondary | ICD-10-CM | POA: Diagnosis not present

## 2016-04-20 DIAGNOSIS — Z87891 Personal history of nicotine dependence: Secondary | ICD-10-CM | POA: Diagnosis not present

## 2016-04-20 DIAGNOSIS — M48062 Spinal stenosis, lumbar region with neurogenic claudication: Secondary | ICD-10-CM | POA: Diagnosis not present

## 2016-04-20 DIAGNOSIS — M5441 Lumbago with sciatica, right side: Secondary | ICD-10-CM | POA: Diagnosis not present

## 2016-04-20 DIAGNOSIS — Z9181 History of falling: Secondary | ICD-10-CM | POA: Diagnosis not present

## 2016-04-20 DIAGNOSIS — E785 Hyperlipidemia, unspecified: Secondary | ICD-10-CM | POA: Diagnosis not present

## 2016-04-24 NOTE — Discharge Summary (Signed)
Physician Discharge Summary  Patient ID: Travis Crawford MRN: [id number] DOB/AGE: [birth year] 66 y.o.  Admit date: 04/17/2016 Discharge date: 04/24/2016  Admission Diagnoses:  Lumbar Spinal stenosis  Discharge Diagnoses:  Active Problems:   Spinal stenosis   Past Medical History:  Diagnosis Date  . Arthritis   . Complication of anesthesia    woke up early during hemorrhoid surgery  . Diabetes mellitus without complication ([place])    type 2  . History of kidney stones    one time  . Hypertension   . Pneumonia   . Sleep apnea    wears cpap    Surgeries: Procedure(s): LUMBAR LAMINECTOMY/DECOMPRESSION MICRODISCECTOMY 2 LEVELS on 04/17/2016   Consultants (if any):   Discharged Condition: Improved  Hospital Course: Travis Crawford is an 66 y.o. male who was admitted 04/17/2016 with a diagnosis of Lumbar Spinal stenosis and went to the operating room on 04/17/2016 and underwent the above named procedures.  Post op day one pt reports a low level of pain controlled on oral medications.  He reports decreased leg pain.  No difficulty with urination.  Pt is ambulating in hallway.  Home health PT was recommended upon DC.   He was given perioperative antibiotics:  Anti-infectives    Start     Dose/Rate Route Frequency Ordered Stop   04/17/16 1600  ceFAZolin (ANCEF) IVPB 2g/100 mL premix     2 g 200 mL/hr over 30 Minutes Intravenous Every 8 hours 04/17/16 1253 04/17/16 2347   04/17/16 0643  ceFAZolin (ANCEF) 2-4 GM/100ML-% IVPB    Comments:  Schonewitz, Leigh   : cabinet override      04/17/16 0643 04/17/16 0846   04/17/16 0639  ceFAZolin (ANCEF) IVPB 2g/100 mL premix     2 g 200 mL/hr over 30 Minutes Intravenous 30 min pre-op 04/17/16 0639 04/17/16 0846    .  He was given sequential compression devices, early ambulation, and TED for DVT prophylaxis.  He benefited maximally from the hospital stay and there were no complications.    Recent vital signs:  Vitals:   04/19/16 0320 04/19/16 0801  BP: 136/71 (!) 152/82  Pulse: 79 75  Resp: 18 18  Temp: 98.1 F (36.7 C) 98.8 F (37.1 C)    Recent laboratory studies:  Lab Results  Component Value Date   HGB 14.8 04/10/2016   Lab Results  Component Value Date   WBC 9.9 04/10/2016   PLT 226 04/10/2016   No results found for: INR Lab Results  Component Value Date   NA 138 04/10/2016   K 3.5 04/10/2016   CL 94 (L) 04/10/2016   CO2 34 (H) 04/10/2016   BUN 17 04/10/2016   CREATININE 0.89 04/10/2016   GLUCOSE 165 (H) 04/10/2016    Discharge Medications:   Allergies as of 04/19/2016      Reactions   No Known Allergies       Medication List    TAKE these medications   atenolol-chlorthalidone 50-25 MG tablet Commonly known as:  TENORETIC Take 1 tablet by mouth daily.   lovastatin 20 MG tablet Commonly known as:  MEVACOR Take 20 mg by mouth at bedtime.   metFORMIN 500 MG tablet Commonly known as:  GLUCOPHAGE Take 500 mg by mouth 2 (two) times daily with a meal.   methocarbamol 500 MG tablet Commonly known as:  ROBAXIN Take 1 tablet (500 mg total) by mouth 3 (three) times daily as needed for muscle spasms.   ondansetron 4 MG tablet  Commonly known as:  ZOFRAN Take 1 tablet (4 mg total) by mouth every 8 (eight) hours as needed for nausea or vomiting.   oxyCODONE-acetaminophen 10-325 MG tablet Commonly known as:  PERCOCET Take 1 tablet by mouth every 4 (four) hours as needed for pain.   potassium chloride SA 20 MEQ tablet Commonly known as:  K-DUR,KLOR-CON Take 20 mEq by mouth daily.       Diagnostic Studies: Dg Lumbar Spine 2-3 Views  Result Date: 04/17/2016 CLINICAL DATA:  Elective surgery.  Intraoperative localization. EXAM: LUMBAR SPINE - 2-3 VIEW COMPARISON:  Preoperative MRI 02/02/2016 FINDINGS: Spinal numbering as established on preoperative MRI. A retractor overlaps the L5 spinous process and a probe projects posteriorly at the L5-S1 level. These results were called by  telephone at the time of interpretation on 04/17/2016 at 9:28 am to OR staff at [id number] IMPRESSION: Surgical probe projects posteriorly at the L5-S1 disc space. Electronically Signed   By: Monte Fantasia M.D.   On: 04/17/2016 09:29    Disposition: 01-Home or Self Care Post op medication provided for pt Pt will present to clinic in 2 weeks   Follow-up Information    BROOKS,DAHARI D, MD. Schedule an appointment as soon as possible for a visit in 2 week(s).   Specialty:  Orthopedic Surgery Why:  If symptoms worsen, For suture removal, For wound re-check Contact information: [address] [place] [address] [phone]        [facility] Follow up.   Specialty:  [place] Why:  A representative from [facility] will contact you to arrange start date and time for therapy. Contact information: [address] [place] [address] [id number]            Signed: Valinda Crawford 04/24/2016, 10:56 AM

## 2016-04-24 NOTE — Progress Notes (Signed)
Late entry for missed gcode    04/18/16 0855  OT Time Calculation  OT Start Time (ACUTE ONLY) 0830  OT Stop Time (ACUTE ONLY) 0855  OT Time Calculation (min) 25 min  OT G-codes **NOT FOR INPATIENT CLASS**  Functional Assessment Tool Used Clinical judgement  Functional Limitation Self care  Self Care Current Status (M8406) CJ  Self Care Goal Status (R8614) CI  Self Care Discharge Status (A3073) CI  OT General Charges  $OT Visit 1 Procedure  OT Evaluation  $OT Eval Moderate Complexity 1 Procedure  OT Treatments  $Self Care/Home Management  8-22 mins   Truman Hayward M.S., OTR/L Pager: [phone]

## 2016-04-25 DIAGNOSIS — I10 Essential (primary) hypertension: Secondary | ICD-10-CM | POA: Diagnosis not present

## 2016-04-25 DIAGNOSIS — E119 Type 2 diabetes mellitus without complications: Secondary | ICD-10-CM | POA: Diagnosis not present

## 2016-04-25 DIAGNOSIS — Z9181 History of falling: Secondary | ICD-10-CM | POA: Diagnosis not present

## 2016-04-25 DIAGNOSIS — Z4789 Encounter for other orthopedic aftercare: Secondary | ICD-10-CM | POA: Diagnosis not present

## 2016-04-25 DIAGNOSIS — K219 Gastro-esophageal reflux disease without esophagitis: Secondary | ICD-10-CM | POA: Diagnosis not present

## 2016-04-25 DIAGNOSIS — M48062 Spinal stenosis, lumbar region with neurogenic claudication: Secondary | ICD-10-CM | POA: Diagnosis not present

## 2016-04-25 DIAGNOSIS — E785 Hyperlipidemia, unspecified: Secondary | ICD-10-CM | POA: Diagnosis not present

## 2016-04-25 DIAGNOSIS — Z87891 Personal history of nicotine dependence: Secondary | ICD-10-CM | POA: Diagnosis not present

## 2016-04-25 DIAGNOSIS — M5441 Lumbago with sciatica, right side: Secondary | ICD-10-CM | POA: Diagnosis not present

## 2016-04-25 DIAGNOSIS — Z7984 Long term (current) use of oral hypoglycemic drugs: Secondary | ICD-10-CM | POA: Diagnosis not present

## 2016-04-26 DIAGNOSIS — E119 Type 2 diabetes mellitus without complications: Secondary | ICD-10-CM | POA: Diagnosis not present

## 2016-04-26 DIAGNOSIS — I10 Essential (primary) hypertension: Secondary | ICD-10-CM | POA: Diagnosis not present

## 2016-04-26 DIAGNOSIS — K219 Gastro-esophageal reflux disease without esophagitis: Secondary | ICD-10-CM | POA: Diagnosis not present

## 2016-04-26 DIAGNOSIS — Z9181 History of falling: Secondary | ICD-10-CM | POA: Diagnosis not present

## 2016-04-26 DIAGNOSIS — Z7984 Long term (current) use of oral hypoglycemic drugs: Secondary | ICD-10-CM | POA: Diagnosis not present

## 2016-04-26 DIAGNOSIS — E785 Hyperlipidemia, unspecified: Secondary | ICD-10-CM | POA: Diagnosis not present

## 2016-04-26 DIAGNOSIS — M48062 Spinal stenosis, lumbar region with neurogenic claudication: Secondary | ICD-10-CM | POA: Diagnosis not present

## 2016-04-26 DIAGNOSIS — M5441 Lumbago with sciatica, right side: Secondary | ICD-10-CM | POA: Diagnosis not present

## 2016-04-26 DIAGNOSIS — Z87891 Personal history of nicotine dependence: Secondary | ICD-10-CM | POA: Diagnosis not present

## 2016-04-26 DIAGNOSIS — Z4789 Encounter for other orthopedic aftercare: Secondary | ICD-10-CM | POA: Diagnosis not present

## 2016-04-30 DIAGNOSIS — Z9181 History of falling: Secondary | ICD-10-CM | POA: Diagnosis not present

## 2016-04-30 DIAGNOSIS — Z87891 Personal history of nicotine dependence: Secondary | ICD-10-CM | POA: Diagnosis not present

## 2016-04-30 DIAGNOSIS — E119 Type 2 diabetes mellitus without complications: Secondary | ICD-10-CM | POA: Diagnosis not present

## 2016-04-30 DIAGNOSIS — I10 Essential (primary) hypertension: Secondary | ICD-10-CM | POA: Diagnosis not present

## 2016-04-30 DIAGNOSIS — E785 Hyperlipidemia, unspecified: Secondary | ICD-10-CM | POA: Diagnosis not present

## 2016-04-30 DIAGNOSIS — M5441 Lumbago with sciatica, right side: Secondary | ICD-10-CM | POA: Diagnosis not present

## 2016-04-30 DIAGNOSIS — Z4789 Encounter for other orthopedic aftercare: Secondary | ICD-10-CM | POA: Diagnosis not present

## 2016-04-30 DIAGNOSIS — Z7984 Long term (current) use of oral hypoglycemic drugs: Secondary | ICD-10-CM | POA: Diagnosis not present

## 2016-04-30 DIAGNOSIS — M48062 Spinal stenosis, lumbar region with neurogenic claudication: Secondary | ICD-10-CM | POA: Diagnosis not present

## 2016-04-30 DIAGNOSIS — K219 Gastro-esophageal reflux disease without esophagitis: Secondary | ICD-10-CM | POA: Diagnosis not present

## 2016-05-01 DIAGNOSIS — E785 Hyperlipidemia, unspecified: Secondary | ICD-10-CM | POA: Diagnosis not present

## 2016-05-01 DIAGNOSIS — E119 Type 2 diabetes mellitus without complications: Secondary | ICD-10-CM | POA: Diagnosis not present

## 2016-05-01 DIAGNOSIS — I10 Essential (primary) hypertension: Secondary | ICD-10-CM | POA: Diagnosis not present

## 2016-05-01 DIAGNOSIS — Z7984 Long term (current) use of oral hypoglycemic drugs: Secondary | ICD-10-CM | POA: Diagnosis not present

## 2016-05-01 DIAGNOSIS — M48062 Spinal stenosis, lumbar region with neurogenic claudication: Secondary | ICD-10-CM | POA: Diagnosis not present

## 2016-05-01 DIAGNOSIS — Z87891 Personal history of nicotine dependence: Secondary | ICD-10-CM | POA: Diagnosis not present

## 2016-05-01 DIAGNOSIS — K219 Gastro-esophageal reflux disease without esophagitis: Secondary | ICD-10-CM | POA: Diagnosis not present

## 2016-05-01 DIAGNOSIS — M5441 Lumbago with sciatica, right side: Secondary | ICD-10-CM | POA: Diagnosis not present

## 2016-05-01 DIAGNOSIS — Z4789 Encounter for other orthopedic aftercare: Secondary | ICD-10-CM | POA: Diagnosis not present

## 2016-05-01 DIAGNOSIS — Z9181 History of falling: Secondary | ICD-10-CM | POA: Diagnosis not present

## 2016-05-02 DIAGNOSIS — E785 Hyperlipidemia, unspecified: Secondary | ICD-10-CM | POA: Diagnosis not present

## 2016-05-02 DIAGNOSIS — Z9181 History of falling: Secondary | ICD-10-CM | POA: Diagnosis not present

## 2016-05-02 DIAGNOSIS — Z4789 Encounter for other orthopedic aftercare: Secondary | ICD-10-CM | POA: Diagnosis not present

## 2016-05-02 DIAGNOSIS — Z87891 Personal history of nicotine dependence: Secondary | ICD-10-CM | POA: Diagnosis not present

## 2016-05-02 DIAGNOSIS — E119 Type 2 diabetes mellitus without complications: Secondary | ICD-10-CM | POA: Diagnosis not present

## 2016-05-02 DIAGNOSIS — I10 Essential (primary) hypertension: Secondary | ICD-10-CM | POA: Diagnosis not present

## 2016-05-02 DIAGNOSIS — Z7984 Long term (current) use of oral hypoglycemic drugs: Secondary | ICD-10-CM | POA: Diagnosis not present

## 2016-05-02 DIAGNOSIS — K219 Gastro-esophageal reflux disease without esophagitis: Secondary | ICD-10-CM | POA: Diagnosis not present

## 2016-05-02 DIAGNOSIS — M5441 Lumbago with sciatica, right side: Secondary | ICD-10-CM | POA: Diagnosis not present

## 2016-05-02 DIAGNOSIS — M48062 Spinal stenosis, lumbar region with neurogenic claudication: Secondary | ICD-10-CM | POA: Diagnosis not present

## 2016-05-07 DIAGNOSIS — Z9181 History of falling: Secondary | ICD-10-CM | POA: Diagnosis not present

## 2016-05-07 DIAGNOSIS — E785 Hyperlipidemia, unspecified: Secondary | ICD-10-CM | POA: Diagnosis not present

## 2016-05-07 DIAGNOSIS — Z87891 Personal history of nicotine dependence: Secondary | ICD-10-CM | POA: Diagnosis not present

## 2016-05-07 DIAGNOSIS — M5441 Lumbago with sciatica, right side: Secondary | ICD-10-CM | POA: Diagnosis not present

## 2016-05-07 DIAGNOSIS — E119 Type 2 diabetes mellitus without complications: Secondary | ICD-10-CM | POA: Diagnosis not present

## 2016-05-07 DIAGNOSIS — Z4789 Encounter for other orthopedic aftercare: Secondary | ICD-10-CM | POA: Diagnosis not present

## 2016-05-07 DIAGNOSIS — K219 Gastro-esophageal reflux disease without esophagitis: Secondary | ICD-10-CM | POA: Diagnosis not present

## 2016-05-07 DIAGNOSIS — Z7984 Long term (current) use of oral hypoglycemic drugs: Secondary | ICD-10-CM | POA: Diagnosis not present

## 2016-05-07 DIAGNOSIS — M48062 Spinal stenosis, lumbar region with neurogenic claudication: Secondary | ICD-10-CM | POA: Diagnosis not present

## 2016-05-07 DIAGNOSIS — I10 Essential (primary) hypertension: Secondary | ICD-10-CM | POA: Diagnosis not present

## 2016-05-08 DIAGNOSIS — Z4789 Encounter for other orthopedic aftercare: Secondary | ICD-10-CM | POA: Diagnosis not present

## 2016-05-08 DIAGNOSIS — K219 Gastro-esophageal reflux disease without esophagitis: Secondary | ICD-10-CM | POA: Diagnosis not present

## 2016-05-08 DIAGNOSIS — Z9181 History of falling: Secondary | ICD-10-CM | POA: Diagnosis not present

## 2016-05-08 DIAGNOSIS — Z87891 Personal history of nicotine dependence: Secondary | ICD-10-CM | POA: Diagnosis not present

## 2016-05-08 DIAGNOSIS — M48062 Spinal stenosis, lumbar region with neurogenic claudication: Secondary | ICD-10-CM | POA: Diagnosis not present

## 2016-05-08 DIAGNOSIS — Z7984 Long term (current) use of oral hypoglycemic drugs: Secondary | ICD-10-CM | POA: Diagnosis not present

## 2016-05-08 DIAGNOSIS — E785 Hyperlipidemia, unspecified: Secondary | ICD-10-CM | POA: Diagnosis not present

## 2016-05-08 DIAGNOSIS — I10 Essential (primary) hypertension: Secondary | ICD-10-CM | POA: Diagnosis not present

## 2016-05-08 DIAGNOSIS — M5441 Lumbago with sciatica, right side: Secondary | ICD-10-CM | POA: Diagnosis not present

## 2016-05-08 DIAGNOSIS — E119 Type 2 diabetes mellitus without complications: Secondary | ICD-10-CM | POA: Diagnosis not present

## 2016-05-09 DIAGNOSIS — E119 Type 2 diabetes mellitus without complications: Secondary | ICD-10-CM | POA: Diagnosis not present

## 2016-05-09 DIAGNOSIS — M5441 Lumbago with sciatica, right side: Secondary | ICD-10-CM | POA: Diagnosis not present

## 2016-05-09 DIAGNOSIS — M48062 Spinal stenosis, lumbar region with neurogenic claudication: Secondary | ICD-10-CM | POA: Diagnosis not present

## 2016-05-09 DIAGNOSIS — Z9181 History of falling: Secondary | ICD-10-CM | POA: Diagnosis not present

## 2016-05-09 DIAGNOSIS — Z7984 Long term (current) use of oral hypoglycemic drugs: Secondary | ICD-10-CM | POA: Diagnosis not present

## 2016-05-09 DIAGNOSIS — Z4789 Encounter for other orthopedic aftercare: Secondary | ICD-10-CM | POA: Diagnosis not present

## 2016-05-09 DIAGNOSIS — I10 Essential (primary) hypertension: Secondary | ICD-10-CM | POA: Diagnosis not present

## 2016-05-09 DIAGNOSIS — K219 Gastro-esophageal reflux disease without esophagitis: Secondary | ICD-10-CM | POA: Diagnosis not present

## 2016-05-09 DIAGNOSIS — E785 Hyperlipidemia, unspecified: Secondary | ICD-10-CM | POA: Diagnosis not present

## 2016-05-09 DIAGNOSIS — Z87891 Personal history of nicotine dependence: Secondary | ICD-10-CM | POA: Diagnosis not present

## 2016-05-09 MED FILL — Thrombin For Soln 20000 Unit: CUTANEOUS | Qty: 1 | Status: AC

## 2016-05-11 DIAGNOSIS — Z7984 Long term (current) use of oral hypoglycemic drugs: Secondary | ICD-10-CM | POA: Diagnosis not present

## 2016-05-11 DIAGNOSIS — Z4789 Encounter for other orthopedic aftercare: Secondary | ICD-10-CM | POA: Diagnosis not present

## 2016-05-11 DIAGNOSIS — Z9181 History of falling: Secondary | ICD-10-CM | POA: Diagnosis not present

## 2016-05-11 DIAGNOSIS — E119 Type 2 diabetes mellitus without complications: Secondary | ICD-10-CM | POA: Diagnosis not present

## 2016-05-11 DIAGNOSIS — M48062 Spinal stenosis, lumbar region with neurogenic claudication: Secondary | ICD-10-CM | POA: Diagnosis not present

## 2016-05-11 DIAGNOSIS — K219 Gastro-esophageal reflux disease without esophagitis: Secondary | ICD-10-CM | POA: Diagnosis not present

## 2016-05-11 DIAGNOSIS — Z87891 Personal history of nicotine dependence: Secondary | ICD-10-CM | POA: Diagnosis not present

## 2016-05-11 DIAGNOSIS — I10 Essential (primary) hypertension: Secondary | ICD-10-CM | POA: Diagnosis not present

## 2016-05-11 DIAGNOSIS — M5441 Lumbago with sciatica, right side: Secondary | ICD-10-CM | POA: Diagnosis not present

## 2016-05-11 DIAGNOSIS — E785 Hyperlipidemia, unspecified: Secondary | ICD-10-CM | POA: Diagnosis not present

## 2016-05-15 DIAGNOSIS — I700 Atherosclerosis of aorta: Secondary | ICD-10-CM | POA: Diagnosis not present

## 2016-05-15 DIAGNOSIS — R609 Edema, unspecified: Secondary | ICD-10-CM | POA: Diagnosis not present

## 2016-05-15 DIAGNOSIS — E119 Type 2 diabetes mellitus without complications: Secondary | ICD-10-CM | POA: Diagnosis not present

## 2016-05-15 DIAGNOSIS — I10 Essential (primary) hypertension: Secondary | ICD-10-CM | POA: Diagnosis not present

## 2016-05-15 DIAGNOSIS — R1031 Right lower quadrant pain: Secondary | ICD-10-CM | POA: Diagnosis not present

## 2016-05-15 DIAGNOSIS — M7989 Other specified soft tissue disorders: Secondary | ICD-10-CM | POA: Diagnosis not present

## 2016-05-15 DIAGNOSIS — R1032 Left lower quadrant pain: Secondary | ICD-10-CM | POA: Diagnosis not present

## 2016-05-15 DIAGNOSIS — E7800 Pure hypercholesterolemia, unspecified: Secondary | ICD-10-CM | POA: Diagnosis not present

## 2016-05-16 DIAGNOSIS — Z4789 Encounter for other orthopedic aftercare: Secondary | ICD-10-CM | POA: Diagnosis not present

## 2016-05-16 DIAGNOSIS — K219 Gastro-esophageal reflux disease without esophagitis: Secondary | ICD-10-CM | POA: Diagnosis not present

## 2016-05-16 DIAGNOSIS — I10 Essential (primary) hypertension: Secondary | ICD-10-CM | POA: Diagnosis not present

## 2016-05-16 DIAGNOSIS — M48062 Spinal stenosis, lumbar region with neurogenic claudication: Secondary | ICD-10-CM | POA: Diagnosis not present

## 2016-05-16 DIAGNOSIS — Z7984 Long term (current) use of oral hypoglycemic drugs: Secondary | ICD-10-CM | POA: Diagnosis not present

## 2016-05-16 DIAGNOSIS — E785 Hyperlipidemia, unspecified: Secondary | ICD-10-CM | POA: Diagnosis not present

## 2016-05-16 DIAGNOSIS — E119 Type 2 diabetes mellitus without complications: Secondary | ICD-10-CM | POA: Diagnosis not present

## 2016-05-16 DIAGNOSIS — Z87891 Personal history of nicotine dependence: Secondary | ICD-10-CM | POA: Diagnosis not present

## 2016-05-16 DIAGNOSIS — Z9181 History of falling: Secondary | ICD-10-CM | POA: Diagnosis not present

## 2016-05-16 DIAGNOSIS — M5441 Lumbago with sciatica, right side: Secondary | ICD-10-CM | POA: Diagnosis not present

## 2016-05-17 DIAGNOSIS — K219 Gastro-esophageal reflux disease without esophagitis: Secondary | ICD-10-CM | POA: Diagnosis not present

## 2016-05-17 DIAGNOSIS — Z7984 Long term (current) use of oral hypoglycemic drugs: Secondary | ICD-10-CM | POA: Diagnosis not present

## 2016-05-17 DIAGNOSIS — M48062 Spinal stenosis, lumbar region with neurogenic claudication: Secondary | ICD-10-CM | POA: Diagnosis not present

## 2016-05-17 DIAGNOSIS — E119 Type 2 diabetes mellitus without complications: Secondary | ICD-10-CM | POA: Diagnosis not present

## 2016-05-17 DIAGNOSIS — Z9181 History of falling: Secondary | ICD-10-CM | POA: Diagnosis not present

## 2016-05-17 DIAGNOSIS — Z87891 Personal history of nicotine dependence: Secondary | ICD-10-CM | POA: Diagnosis not present

## 2016-05-17 DIAGNOSIS — M5441 Lumbago with sciatica, right side: Secondary | ICD-10-CM | POA: Diagnosis not present

## 2016-05-17 DIAGNOSIS — Z4789 Encounter for other orthopedic aftercare: Secondary | ICD-10-CM | POA: Diagnosis not present

## 2016-05-17 DIAGNOSIS — E785 Hyperlipidemia, unspecified: Secondary | ICD-10-CM | POA: Diagnosis not present

## 2016-05-17 DIAGNOSIS — I10 Essential (primary) hypertension: Secondary | ICD-10-CM | POA: Diagnosis not present

## 2016-05-20 NOTE — Progress Notes (Signed)
   04/17/16 1505  PT G-Codes **NOT FOR INPATIENT CLASS**  Functional Assessment Tool Used AM-PAC 6 Clicks Basic Mobility  Functional Limitation Mobility: Walking and moving around  Mobility: Walking and Moving Around Current Status [phone]) CK  Mobility: Walking and Moving Around Goal Status [phone]) CI  Leighton Roach, [place]

## 2016-05-21 DIAGNOSIS — E785 Hyperlipidemia, unspecified: Secondary | ICD-10-CM | POA: Diagnosis not present

## 2016-05-21 DIAGNOSIS — I10 Essential (primary) hypertension: Secondary | ICD-10-CM | POA: Diagnosis not present

## 2016-05-21 DIAGNOSIS — Z9181 History of falling: Secondary | ICD-10-CM | POA: Diagnosis not present

## 2016-05-21 DIAGNOSIS — K219 Gastro-esophageal reflux disease without esophagitis: Secondary | ICD-10-CM | POA: Diagnosis not present

## 2016-05-21 DIAGNOSIS — M48062 Spinal stenosis, lumbar region with neurogenic claudication: Secondary | ICD-10-CM | POA: Diagnosis not present

## 2016-05-21 DIAGNOSIS — Z7984 Long term (current) use of oral hypoglycemic drugs: Secondary | ICD-10-CM | POA: Diagnosis not present

## 2016-05-21 DIAGNOSIS — E119 Type 2 diabetes mellitus without complications: Secondary | ICD-10-CM | POA: Diagnosis not present

## 2016-05-21 DIAGNOSIS — Z4789 Encounter for other orthopedic aftercare: Secondary | ICD-10-CM | POA: Diagnosis not present

## 2016-05-21 DIAGNOSIS — M5441 Lumbago with sciatica, right side: Secondary | ICD-10-CM | POA: Diagnosis not present

## 2016-05-21 DIAGNOSIS — Z87891 Personal history of nicotine dependence: Secondary | ICD-10-CM | POA: Diagnosis not present

## 2016-05-23 DIAGNOSIS — Z9181 History of falling: Secondary | ICD-10-CM | POA: Diagnosis not present

## 2016-05-23 DIAGNOSIS — E785 Hyperlipidemia, unspecified: Secondary | ICD-10-CM | POA: Diagnosis not present

## 2016-05-23 DIAGNOSIS — Z87891 Personal history of nicotine dependence: Secondary | ICD-10-CM | POA: Diagnosis not present

## 2016-05-23 DIAGNOSIS — Z4789 Encounter for other orthopedic aftercare: Secondary | ICD-10-CM | POA: Diagnosis not present

## 2016-05-23 DIAGNOSIS — M5441 Lumbago with sciatica, right side: Secondary | ICD-10-CM | POA: Diagnosis not present

## 2016-05-23 DIAGNOSIS — K219 Gastro-esophageal reflux disease without esophagitis: Secondary | ICD-10-CM | POA: Diagnosis not present

## 2016-05-23 DIAGNOSIS — I10 Essential (primary) hypertension: Secondary | ICD-10-CM | POA: Diagnosis not present

## 2016-05-23 DIAGNOSIS — Z7984 Long term (current) use of oral hypoglycemic drugs: Secondary | ICD-10-CM | POA: Diagnosis not present

## 2016-05-23 DIAGNOSIS — M48062 Spinal stenosis, lumbar region with neurogenic claudication: Secondary | ICD-10-CM | POA: Diagnosis not present

## 2016-05-23 DIAGNOSIS — E119 Type 2 diabetes mellitus without complications: Secondary | ICD-10-CM | POA: Diagnosis not present

## 2016-05-28 DIAGNOSIS — Z87891 Personal history of nicotine dependence: Secondary | ICD-10-CM | POA: Diagnosis not present

## 2016-05-28 DIAGNOSIS — M5441 Lumbago with sciatica, right side: Secondary | ICD-10-CM | POA: Diagnosis not present

## 2016-05-28 DIAGNOSIS — I10 Essential (primary) hypertension: Secondary | ICD-10-CM | POA: Diagnosis not present

## 2016-05-28 DIAGNOSIS — M48062 Spinal stenosis, lumbar region with neurogenic claudication: Secondary | ICD-10-CM | POA: Diagnosis not present

## 2016-05-28 DIAGNOSIS — K219 Gastro-esophageal reflux disease without esophagitis: Secondary | ICD-10-CM | POA: Diagnosis not present

## 2016-05-28 DIAGNOSIS — Z4789 Encounter for other orthopedic aftercare: Secondary | ICD-10-CM | POA: Diagnosis not present

## 2016-05-28 DIAGNOSIS — Z7984 Long term (current) use of oral hypoglycemic drugs: Secondary | ICD-10-CM | POA: Diagnosis not present

## 2016-05-28 DIAGNOSIS — E119 Type 2 diabetes mellitus without complications: Secondary | ICD-10-CM | POA: Diagnosis not present

## 2016-05-28 DIAGNOSIS — E785 Hyperlipidemia, unspecified: Secondary | ICD-10-CM | POA: Diagnosis not present

## 2016-05-28 DIAGNOSIS — Z9181 History of falling: Secondary | ICD-10-CM | POA: Diagnosis not present

## 2016-05-29 DIAGNOSIS — M5126 Other intervertebral disc displacement, lumbar region: Secondary | ICD-10-CM | POA: Diagnosis not present

## 2016-05-29 DIAGNOSIS — R600 Localized edema: Secondary | ICD-10-CM | POA: Diagnosis not present

## 2016-05-29 DIAGNOSIS — I10 Essential (primary) hypertension: Secondary | ICD-10-CM | POA: Diagnosis not present

## 2016-05-30 DIAGNOSIS — Z9181 History of falling: Secondary | ICD-10-CM | POA: Diagnosis not present

## 2016-05-30 DIAGNOSIS — M5441 Lumbago with sciatica, right side: Secondary | ICD-10-CM | POA: Diagnosis not present

## 2016-05-30 DIAGNOSIS — I10 Essential (primary) hypertension: Secondary | ICD-10-CM | POA: Diagnosis not present

## 2016-05-30 DIAGNOSIS — E785 Hyperlipidemia, unspecified: Secondary | ICD-10-CM | POA: Diagnosis not present

## 2016-05-30 DIAGNOSIS — M48062 Spinal stenosis, lumbar region with neurogenic claudication: Secondary | ICD-10-CM | POA: Diagnosis not present

## 2016-05-30 DIAGNOSIS — Z87891 Personal history of nicotine dependence: Secondary | ICD-10-CM | POA: Diagnosis not present

## 2016-05-30 DIAGNOSIS — Z7984 Long term (current) use of oral hypoglycemic drugs: Secondary | ICD-10-CM | POA: Diagnosis not present

## 2016-05-30 DIAGNOSIS — Z4789 Encounter for other orthopedic aftercare: Secondary | ICD-10-CM | POA: Diagnosis not present

## 2016-05-30 DIAGNOSIS — K219 Gastro-esophageal reflux disease without esophagitis: Secondary | ICD-10-CM | POA: Diagnosis not present

## 2016-05-30 DIAGNOSIS — E119 Type 2 diabetes mellitus without complications: Secondary | ICD-10-CM | POA: Diagnosis not present

## 2016-06-04 DIAGNOSIS — E119 Type 2 diabetes mellitus without complications: Secondary | ICD-10-CM | POA: Diagnosis not present

## 2016-06-04 DIAGNOSIS — E785 Hyperlipidemia, unspecified: Secondary | ICD-10-CM | POA: Diagnosis not present

## 2016-06-04 DIAGNOSIS — Z9181 History of falling: Secondary | ICD-10-CM | POA: Diagnosis not present

## 2016-06-04 DIAGNOSIS — Z7984 Long term (current) use of oral hypoglycemic drugs: Secondary | ICD-10-CM | POA: Diagnosis not present

## 2016-06-04 DIAGNOSIS — M5441 Lumbago with sciatica, right side: Secondary | ICD-10-CM | POA: Diagnosis not present

## 2016-06-04 DIAGNOSIS — I10 Essential (primary) hypertension: Secondary | ICD-10-CM | POA: Diagnosis not present

## 2016-06-04 DIAGNOSIS — Z4789 Encounter for other orthopedic aftercare: Secondary | ICD-10-CM | POA: Diagnosis not present

## 2016-06-04 DIAGNOSIS — M48062 Spinal stenosis, lumbar region with neurogenic claudication: Secondary | ICD-10-CM | POA: Diagnosis not present

## 2016-06-04 DIAGNOSIS — K219 Gastro-esophageal reflux disease without esophagitis: Secondary | ICD-10-CM | POA: Diagnosis not present

## 2016-06-04 DIAGNOSIS — Z87891 Personal history of nicotine dependence: Secondary | ICD-10-CM | POA: Diagnosis not present

## 2016-06-06 DIAGNOSIS — Z9181 History of falling: Secondary | ICD-10-CM | POA: Diagnosis not present

## 2016-06-06 DIAGNOSIS — E119 Type 2 diabetes mellitus without complications: Secondary | ICD-10-CM | POA: Diagnosis not present

## 2016-06-06 DIAGNOSIS — M48062 Spinal stenosis, lumbar region with neurogenic claudication: Secondary | ICD-10-CM | POA: Diagnosis not present

## 2016-06-06 DIAGNOSIS — M5441 Lumbago with sciatica, right side: Secondary | ICD-10-CM | POA: Diagnosis not present

## 2016-06-06 DIAGNOSIS — I10 Essential (primary) hypertension: Secondary | ICD-10-CM | POA: Diagnosis not present

## 2016-06-06 DIAGNOSIS — Z4789 Encounter for other orthopedic aftercare: Secondary | ICD-10-CM | POA: Diagnosis not present

## 2016-06-06 DIAGNOSIS — Z87891 Personal history of nicotine dependence: Secondary | ICD-10-CM | POA: Diagnosis not present

## 2016-06-06 DIAGNOSIS — K219 Gastro-esophageal reflux disease without esophagitis: Secondary | ICD-10-CM | POA: Diagnosis not present

## 2016-06-06 DIAGNOSIS — E785 Hyperlipidemia, unspecified: Secondary | ICD-10-CM | POA: Diagnosis not present

## 2016-06-06 DIAGNOSIS — Z7984 Long term (current) use of oral hypoglycemic drugs: Secondary | ICD-10-CM | POA: Diagnosis not present

## 2016-06-12 DIAGNOSIS — R600 Localized edema: Secondary | ICD-10-CM | POA: Diagnosis not present

## 2016-06-12 DIAGNOSIS — J309 Allergic rhinitis, unspecified: Secondary | ICD-10-CM | POA: Diagnosis not present

## 2016-06-12 DIAGNOSIS — I10 Essential (primary) hypertension: Secondary | ICD-10-CM | POA: Diagnosis not present

## 2016-06-17 ENCOUNTER — Encounter (HOSPITAL_COMMUNITY): Payer: Medicare Other

## 2016-07-11 DIAGNOSIS — J209 Acute bronchitis, unspecified: Secondary | ICD-10-CM | POA: Diagnosis not present

## 2016-07-11 DIAGNOSIS — H1031 Unspecified acute conjunctivitis, right eye: Secondary | ICD-10-CM | POA: Diagnosis not present

## 2016-07-22 DIAGNOSIS — E119 Type 2 diabetes mellitus without complications: Secondary | ICD-10-CM | POA: Diagnosis not present

## 2016-08-01 DIAGNOSIS — H2513 Age-related nuclear cataract, bilateral: Secondary | ICD-10-CM | POA: Diagnosis not present

## 2016-08-26 DIAGNOSIS — I10 Essential (primary) hypertension: Secondary | ICD-10-CM | POA: Diagnosis not present

## 2016-08-26 DIAGNOSIS — R600 Localized edema: Secondary | ICD-10-CM | POA: Diagnosis not present

## 2016-08-26 DIAGNOSIS — E119 Type 2 diabetes mellitus without complications: Secondary | ICD-10-CM | POA: Diagnosis not present

## 2016-08-26 DIAGNOSIS — E785 Hyperlipidemia, unspecified: Secondary | ICD-10-CM | POA: Diagnosis not present

## 2016-10-15 DIAGNOSIS — I10 Essential (primary) hypertension: Secondary | ICD-10-CM | POA: Diagnosis not present

## 2016-10-15 DIAGNOSIS — R600 Localized edema: Secondary | ICD-10-CM | POA: Diagnosis not present

## 2016-10-22 DIAGNOSIS — I10 Essential (primary) hypertension: Secondary | ICD-10-CM | POA: Diagnosis not present

## 2016-10-22 DIAGNOSIS — R600 Localized edema: Secondary | ICD-10-CM | POA: Diagnosis not present

## 2016-10-28 DIAGNOSIS — Z7689 Persons encountering health services in other specified circumstances: Secondary | ICD-10-CM | POA: Diagnosis not present

## 2016-11-12 DIAGNOSIS — Z1389 Encounter for screening for other disorder: Secondary | ICD-10-CM | POA: Diagnosis not present

## 2016-11-12 DIAGNOSIS — E785 Hyperlipidemia, unspecified: Secondary | ICD-10-CM | POA: Diagnosis not present

## 2016-11-12 DIAGNOSIS — Z9181 History of falling: Secondary | ICD-10-CM | POA: Diagnosis not present

## 2016-11-12 DIAGNOSIS — Z1211 Encounter for screening for malignant neoplasm of colon: Secondary | ICD-10-CM | POA: Diagnosis not present

## 2016-11-12 DIAGNOSIS — Z23 Encounter for immunization: Secondary | ICD-10-CM | POA: Diagnosis not present

## 2016-11-12 DIAGNOSIS — Z0000 Encounter for general adult medical examination without abnormal findings: Secondary | ICD-10-CM | POA: Diagnosis not present

## 2016-11-12 DIAGNOSIS — Z125 Encounter for screening for malignant neoplasm of prostate: Secondary | ICD-10-CM | POA: Diagnosis not present

## 2016-11-22 DIAGNOSIS — I10 Essential (primary) hypertension: Secondary | ICD-10-CM | POA: Diagnosis not present

## 2016-11-22 DIAGNOSIS — R600 Localized edema: Secondary | ICD-10-CM | POA: Diagnosis not present

## 2016-11-22 DIAGNOSIS — Z1389 Encounter for screening for other disorder: Secondary | ICD-10-CM | POA: Diagnosis not present

## 2016-11-22 DIAGNOSIS — E119 Type 2 diabetes mellitus without complications: Secondary | ICD-10-CM | POA: Diagnosis not present

## 2016-11-22 DIAGNOSIS — E785 Hyperlipidemia, unspecified: Secondary | ICD-10-CM | POA: Diagnosis not present

## 2017-01-09 DIAGNOSIS — G4733 Obstructive sleep apnea (adult) (pediatric): Secondary | ICD-10-CM | POA: Diagnosis not present

## 2017-02-27 DIAGNOSIS — E1165 Type 2 diabetes mellitus with hyperglycemia: Secondary | ICD-10-CM | POA: Diagnosis not present

## 2017-02-27 DIAGNOSIS — E785 Hyperlipidemia, unspecified: Secondary | ICD-10-CM | POA: Diagnosis not present

## 2017-02-27 DIAGNOSIS — J309 Allergic rhinitis, unspecified: Secondary | ICD-10-CM | POA: Diagnosis not present

## 2017-02-27 DIAGNOSIS — I10 Essential (primary) hypertension: Secondary | ICD-10-CM | POA: Diagnosis not present

## 2017-02-27 DIAGNOSIS — Z139 Encounter for screening, unspecified: Secondary | ICD-10-CM | POA: Diagnosis not present

## 2017-08-28 DIAGNOSIS — Z1339 Encounter for screening examination for other mental health and behavioral disorders: Secondary | ICD-10-CM | POA: Diagnosis not present

## 2017-08-28 DIAGNOSIS — L03115 Cellulitis of right lower limb: Secondary | ICD-10-CM | POA: Diagnosis not present

## 2017-08-28 DIAGNOSIS — I10 Essential (primary) hypertension: Secondary | ICD-10-CM | POA: Diagnosis not present

## 2017-08-28 DIAGNOSIS — E1165 Type 2 diabetes mellitus with hyperglycemia: Secondary | ICD-10-CM | POA: Diagnosis not present

## 2017-08-28 DIAGNOSIS — E785 Hyperlipidemia, unspecified: Secondary | ICD-10-CM | POA: Diagnosis not present

## 2017-11-13 DIAGNOSIS — Z23 Encounter for immunization: Secondary | ICD-10-CM | POA: Diagnosis not present

## 2017-11-13 DIAGNOSIS — E875 Hyperkalemia: Secondary | ICD-10-CM | POA: Diagnosis not present

## 2017-11-13 DIAGNOSIS — E785 Hyperlipidemia, unspecified: Secondary | ICD-10-CM | POA: Diagnosis not present

## 2017-11-13 DIAGNOSIS — Z0000 Encounter for general adult medical examination without abnormal findings: Secondary | ICD-10-CM | POA: Diagnosis not present

## 2017-11-24 DIAGNOSIS — T148XXA Other injury of unspecified body region, initial encounter: Secondary | ICD-10-CM | POA: Diagnosis not present

## 2017-11-24 DIAGNOSIS — L03116 Cellulitis of left lower limb: Secondary | ICD-10-CM | POA: Diagnosis not present

## 2017-11-24 DIAGNOSIS — Z9181 History of falling: Secondary | ICD-10-CM | POA: Diagnosis not present

## 2017-12-01 DIAGNOSIS — L03116 Cellulitis of left lower limb: Secondary | ICD-10-CM | POA: Diagnosis not present

## 2017-12-01 DIAGNOSIS — T148XXA Other injury of unspecified body region, initial encounter: Secondary | ICD-10-CM | POA: Diagnosis not present

## 2017-12-16 DIAGNOSIS — Z79891 Long term (current) use of opiate analgesic: Secondary | ICD-10-CM | POA: Diagnosis not present

## 2017-12-16 DIAGNOSIS — G8929 Other chronic pain: Secondary | ICD-10-CM | POA: Diagnosis not present

## 2017-12-16 DIAGNOSIS — Z7984 Long term (current) use of oral hypoglycemic drugs: Secondary | ICD-10-CM | POA: Diagnosis not present

## 2017-12-16 DIAGNOSIS — M5136 Other intervertebral disc degeneration, lumbar region: Secondary | ICD-10-CM | POA: Diagnosis not present

## 2017-12-16 DIAGNOSIS — E119 Type 2 diabetes mellitus without complications: Secondary | ICD-10-CM | POA: Diagnosis not present

## 2017-12-16 DIAGNOSIS — Z79899 Other long term (current) drug therapy: Secondary | ICD-10-CM | POA: Diagnosis not present

## 2017-12-16 DIAGNOSIS — I10 Essential (primary) hypertension: Secondary | ICD-10-CM | POA: Diagnosis not present

## 2017-12-19 DIAGNOSIS — M5431 Sciatica, right side: Secondary | ICD-10-CM | POA: Diagnosis not present

## 2017-12-19 DIAGNOSIS — M5136 Other intervertebral disc degeneration, lumbar region: Secondary | ICD-10-CM | POA: Diagnosis not present

## 2017-12-24 DIAGNOSIS — M5431 Sciatica, right side: Secondary | ICD-10-CM

## 2017-12-24 DIAGNOSIS — M47816 Spondylosis without myelopathy or radiculopathy, lumbar region: Secondary | ICD-10-CM | POA: Insufficient documentation

## 2017-12-24 DIAGNOSIS — M25551 Pain in right hip: Secondary | ICD-10-CM | POA: Insufficient documentation

## 2017-12-24 DIAGNOSIS — M48062 Spinal stenosis, lumbar region with neurogenic claudication: Secondary | ICD-10-CM | POA: Diagnosis not present

## 2017-12-24 HISTORY — DX: Sciatica, right side: M54.31

## 2017-12-24 HISTORY — DX: Spondylosis without myelopathy or radiculopathy, lumbar region: M47.816

## 2017-12-24 HISTORY — DX: Pain in right hip: M25.551

## 2018-01-08 DIAGNOSIS — M47816 Spondylosis without myelopathy or radiculopathy, lumbar region: Secondary | ICD-10-CM | POA: Diagnosis not present

## 2018-01-08 DIAGNOSIS — M5126 Other intervertebral disc displacement, lumbar region: Secondary | ICD-10-CM | POA: Diagnosis not present

## 2018-01-08 DIAGNOSIS — M25551 Pain in right hip: Secondary | ICD-10-CM | POA: Diagnosis not present

## 2018-01-08 DIAGNOSIS — M160 Bilateral primary osteoarthritis of hip: Secondary | ICD-10-CM | POA: Diagnosis not present

## 2018-01-08 DIAGNOSIS — M5431 Sciatica, right side: Secondary | ICD-10-CM | POA: Diagnosis not present

## 2018-01-08 DIAGNOSIS — M48061 Spinal stenosis, lumbar region without neurogenic claudication: Secondary | ICD-10-CM | POA: Diagnosis not present

## 2018-01-08 DIAGNOSIS — M48062 Spinal stenosis, lumbar region with neurogenic claudication: Secondary | ICD-10-CM | POA: Diagnosis not present

## 2018-01-30 DIAGNOSIS — M5416 Radiculopathy, lumbar region: Secondary | ICD-10-CM | POA: Diagnosis not present

## 2018-01-30 DIAGNOSIS — I739 Peripheral vascular disease, unspecified: Secondary | ICD-10-CM | POA: Diagnosis not present

## 2018-01-30 DIAGNOSIS — E119 Type 2 diabetes mellitus without complications: Secondary | ICD-10-CM | POA: Diagnosis not present

## 2018-03-02 DIAGNOSIS — E118 Type 2 diabetes mellitus with unspecified complications: Secondary | ICD-10-CM | POA: Diagnosis not present

## 2018-03-02 DIAGNOSIS — E785 Hyperlipidemia, unspecified: Secondary | ICD-10-CM | POA: Diagnosis not present

## 2018-03-02 DIAGNOSIS — R600 Localized edema: Secondary | ICD-10-CM | POA: Diagnosis not present

## 2018-03-02 DIAGNOSIS — I10 Essential (primary) hypertension: Secondary | ICD-10-CM | POA: Diagnosis not present

## 2018-03-02 DIAGNOSIS — G4733 Obstructive sleep apnea (adult) (pediatric): Secondary | ICD-10-CM | POA: Diagnosis not present

## 2018-04-11 IMAGING — CR DG LUMBAR SPINE 2-3V
2 series · 2 of 2 positions shown · non-contrast
Comparison: Preoperative MRI 02/02/2016

CLINICAL DATA: Elective surgery.  Intraoperative localization.

EXAM:
LUMBAR SPINE - 2-3 VIEW

[xtable lateral (1 of 2)]
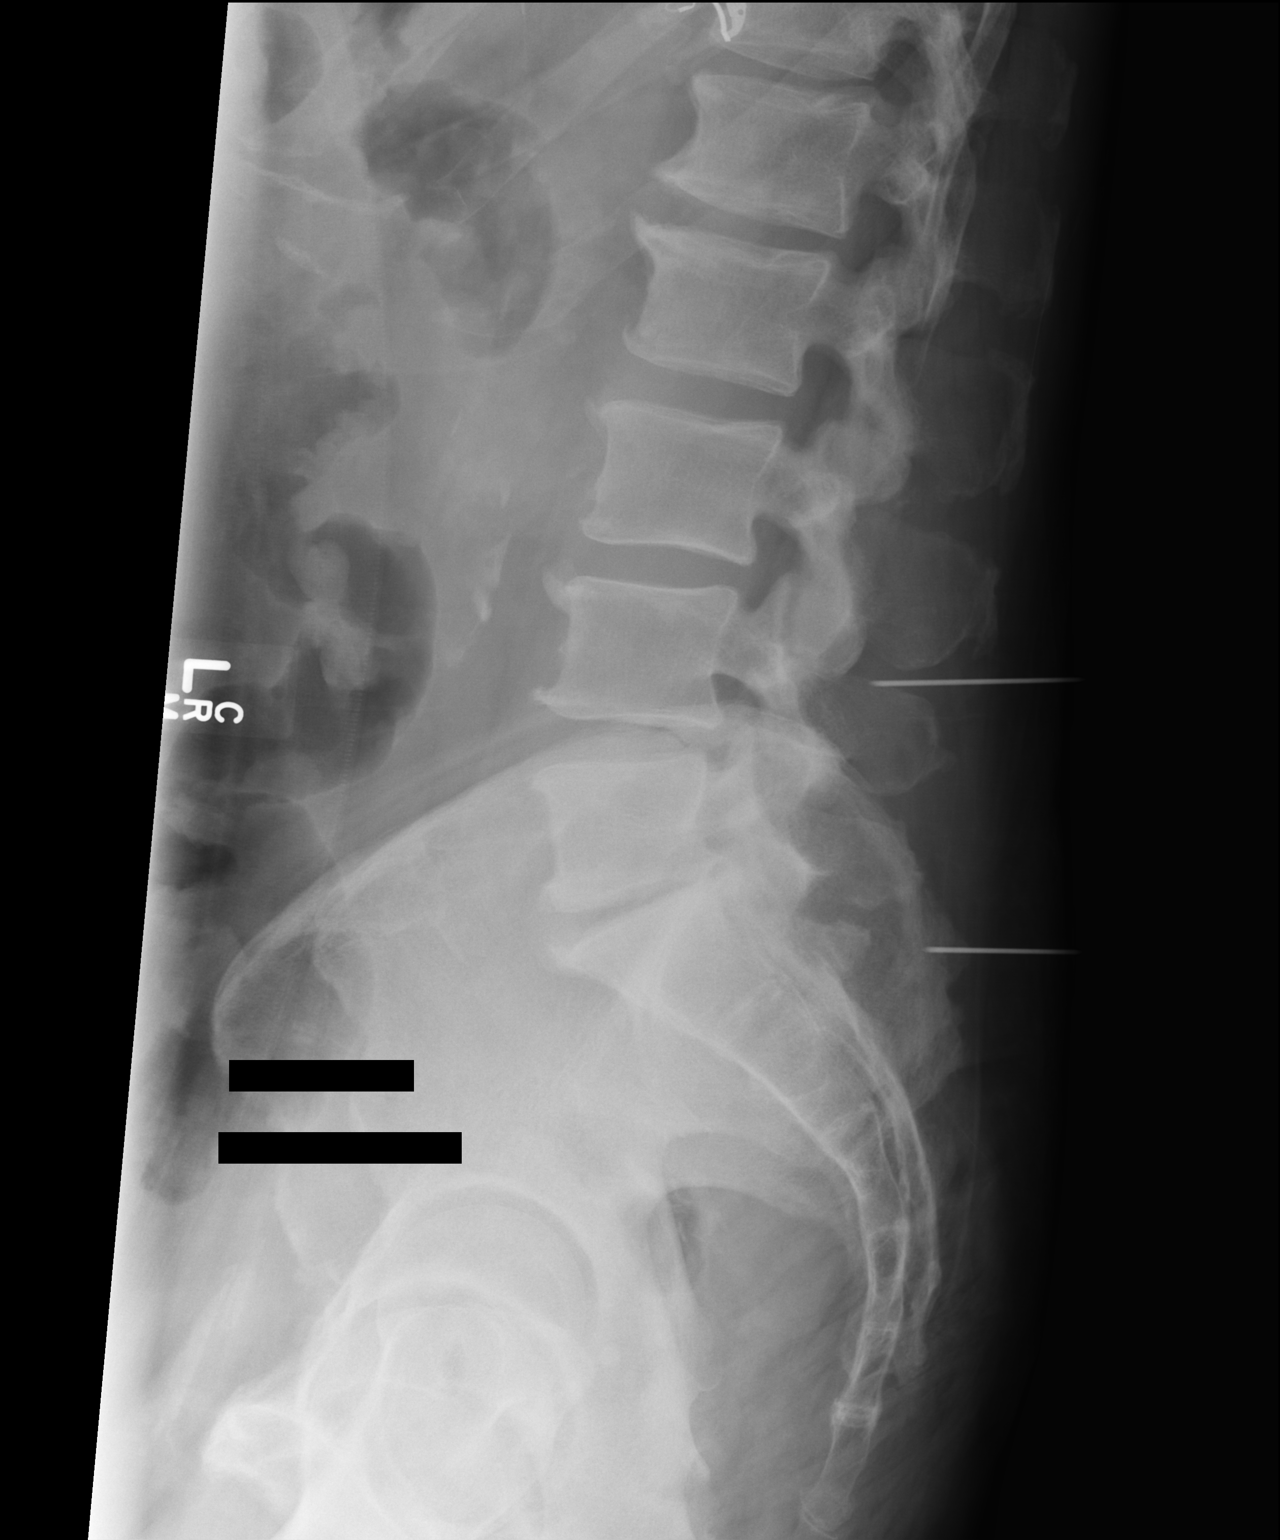

[xtable lateral (2 of 2)]
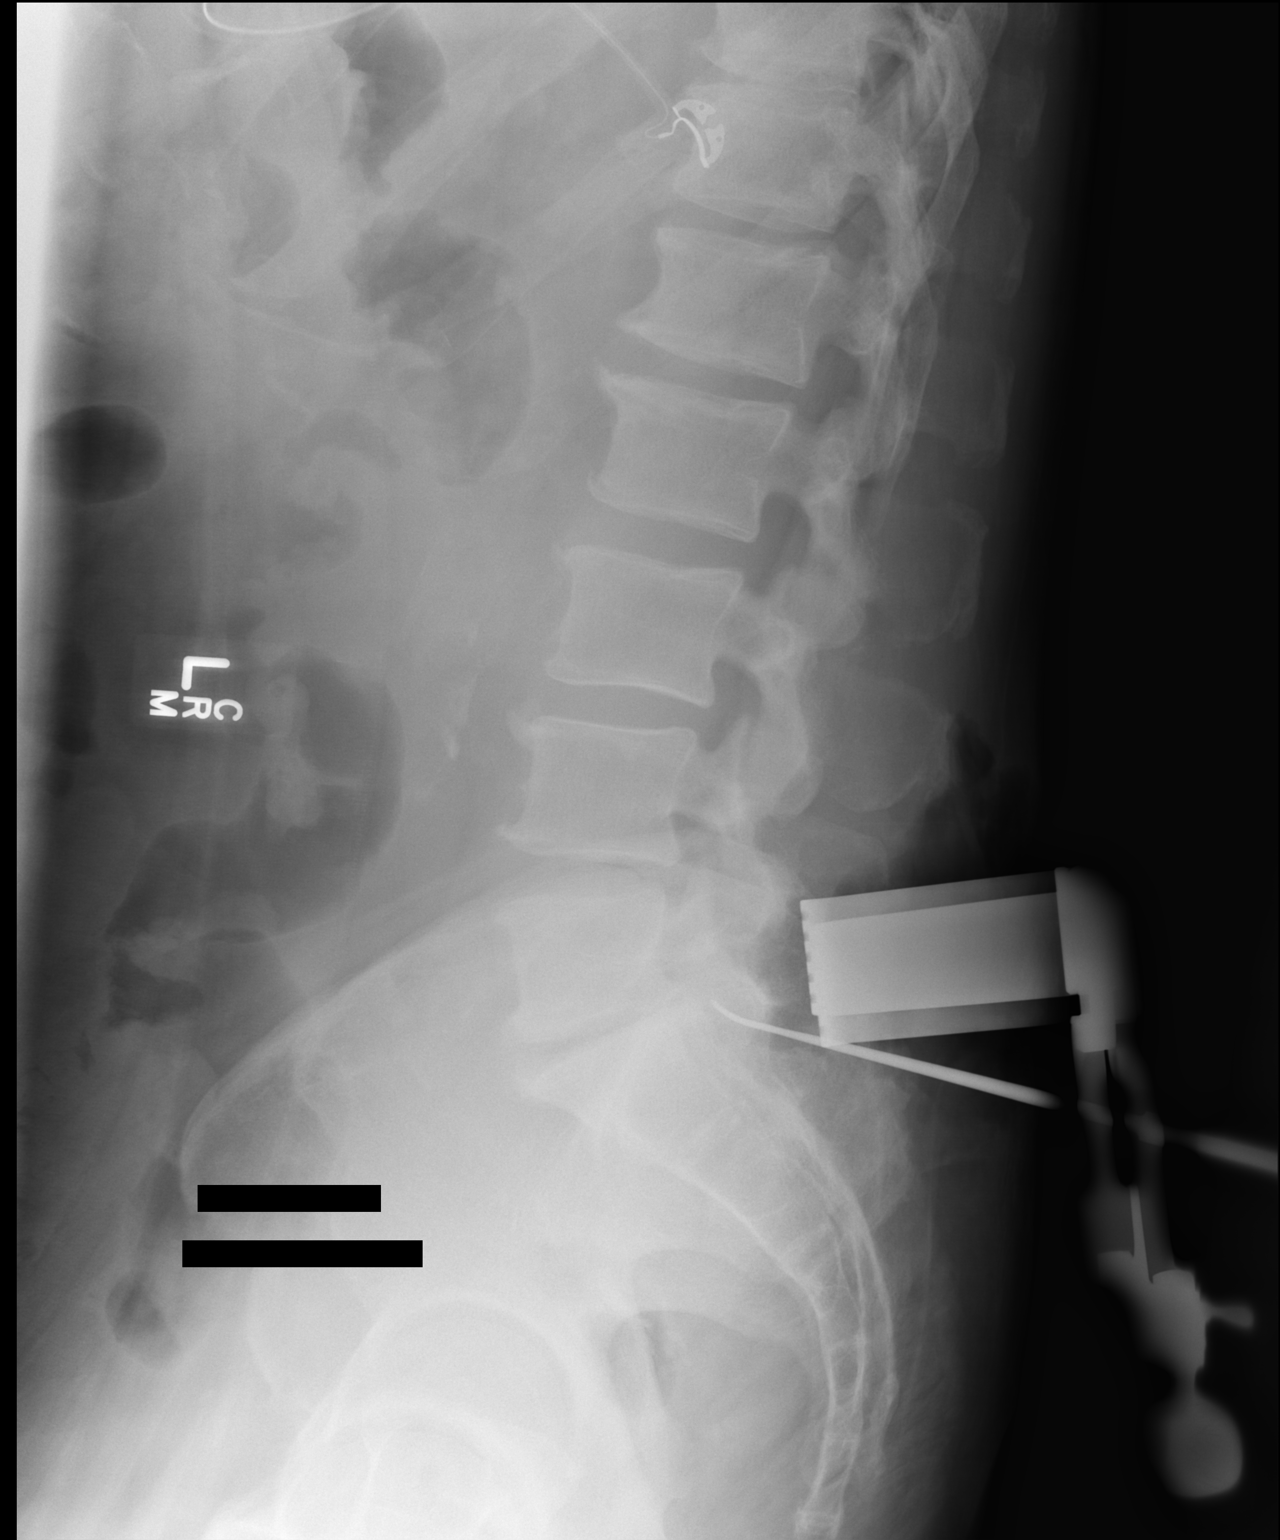

[2 of 2 positions shown; findings below may reference images not displayed]

FINDINGS: Spinal numbering as established on preoperative MRI. A retractor
overlaps the L5 spinous process and a probe projects posteriorly at
the L5-S1 level. These results were called by telephone at the time
IMPRESSION: Surgical probe projects posteriorly at the L5-S1 disc space.

## 2018-09-22 DIAGNOSIS — I10 Essential (primary) hypertension: Secondary | ICD-10-CM | POA: Diagnosis not present

## 2018-09-22 DIAGNOSIS — G4733 Obstructive sleep apnea (adult) (pediatric): Secondary | ICD-10-CM | POA: Diagnosis not present

## 2018-09-22 DIAGNOSIS — E118 Type 2 diabetes mellitus with unspecified complications: Secondary | ICD-10-CM | POA: Diagnosis not present

## 2018-09-22 DIAGNOSIS — Z139 Encounter for screening, unspecified: Secondary | ICD-10-CM | POA: Diagnosis not present

## 2018-09-22 DIAGNOSIS — E785 Hyperlipidemia, unspecified: Secondary | ICD-10-CM | POA: Diagnosis not present

## 2018-11-18 DIAGNOSIS — Z1211 Encounter for screening for malignant neoplasm of colon: Secondary | ICD-10-CM | POA: Diagnosis not present

## 2018-11-18 DIAGNOSIS — E785 Hyperlipidemia, unspecified: Secondary | ICD-10-CM | POA: Diagnosis not present

## 2018-11-18 DIAGNOSIS — Z9181 History of falling: Secondary | ICD-10-CM | POA: Diagnosis not present

## 2018-11-18 DIAGNOSIS — Z0000 Encounter for general adult medical examination without abnormal findings: Secondary | ICD-10-CM | POA: Diagnosis not present

## 2019-01-14 DIAGNOSIS — J069 Acute upper respiratory infection, unspecified: Secondary | ICD-10-CM | POA: Diagnosis not present

## 2019-02-26 DIAGNOSIS — E119 Type 2 diabetes mellitus without complications: Secondary | ICD-10-CM | POA: Diagnosis not present

## 2019-03-30 DIAGNOSIS — E118 Type 2 diabetes mellitus with unspecified complications: Secondary | ICD-10-CM | POA: Diagnosis not present

## 2019-03-30 DIAGNOSIS — I10 Essential (primary) hypertension: Secondary | ICD-10-CM | POA: Diagnosis not present

## 2019-03-30 DIAGNOSIS — G4733 Obstructive sleep apnea (adult) (pediatric): Secondary | ICD-10-CM | POA: Diagnosis not present

## 2019-03-30 DIAGNOSIS — E785 Hyperlipidemia, unspecified: Secondary | ICD-10-CM | POA: Diagnosis not present

## 2019-03-30 DIAGNOSIS — Z23 Encounter for immunization: Secondary | ICD-10-CM | POA: Diagnosis not present

## 2019-04-01 DIAGNOSIS — E118 Type 2 diabetes mellitus with unspecified complications: Secondary | ICD-10-CM | POA: Diagnosis not present

## 2019-05-13 DIAGNOSIS — E118 Type 2 diabetes mellitus with unspecified complications: Secondary | ICD-10-CM | POA: Diagnosis not present

## 2019-07-29 DIAGNOSIS — Z20822 Contact with and (suspected) exposure to covid-19: Secondary | ICD-10-CM | POA: Diagnosis not present

## 2019-07-29 DIAGNOSIS — I10 Essential (primary) hypertension: Secondary | ICD-10-CM | POA: Diagnosis not present

## 2019-07-29 DIAGNOSIS — E785 Hyperlipidemia, unspecified: Secondary | ICD-10-CM | POA: Diagnosis not present

## 2019-07-29 DIAGNOSIS — E118 Type 2 diabetes mellitus with unspecified complications: Secondary | ICD-10-CM | POA: Diagnosis not present

## 2019-10-25 DIAGNOSIS — K1370 Unspecified lesions of oral mucosa: Secondary | ICD-10-CM | POA: Diagnosis not present

## 2019-10-25 DIAGNOSIS — I10 Essential (primary) hypertension: Secondary | ICD-10-CM | POA: Diagnosis not present

## 2019-11-05 DIAGNOSIS — K134 Granuloma and granuloma-like lesions of oral mucosa: Secondary | ICD-10-CM | POA: Diagnosis not present

## 2019-12-02 DIAGNOSIS — Z23 Encounter for immunization: Secondary | ICD-10-CM | POA: Diagnosis not present

## 2019-12-02 DIAGNOSIS — E118 Type 2 diabetes mellitus with unspecified complications: Secondary | ICD-10-CM | POA: Diagnosis not present

## 2019-12-02 DIAGNOSIS — E785 Hyperlipidemia, unspecified: Secondary | ICD-10-CM | POA: Diagnosis not present

## 2019-12-02 DIAGNOSIS — I10 Essential (primary) hypertension: Secondary | ICD-10-CM | POA: Diagnosis not present

## 2020-02-24 DIAGNOSIS — J019 Acute sinusitis, unspecified: Secondary | ICD-10-CM | POA: Diagnosis not present

## 2020-02-28 DIAGNOSIS — G4733 Obstructive sleep apnea (adult) (pediatric): Secondary | ICD-10-CM | POA: Diagnosis not present

## 2020-03-16 DIAGNOSIS — Z9181 History of falling: Secondary | ICD-10-CM | POA: Diagnosis not present

## 2020-03-16 DIAGNOSIS — Z1331 Encounter for screening for depression: Secondary | ICD-10-CM | POA: Diagnosis not present

## 2020-03-16 DIAGNOSIS — Z139 Encounter for screening, unspecified: Secondary | ICD-10-CM | POA: Diagnosis not present

## 2020-03-16 DIAGNOSIS — Z0000 Encounter for general adult medical examination without abnormal findings: Secondary | ICD-10-CM | POA: Diagnosis not present

## 2020-03-16 DIAGNOSIS — E785 Hyperlipidemia, unspecified: Secondary | ICD-10-CM | POA: Diagnosis not present

## 2020-03-16 DIAGNOSIS — E669 Obesity, unspecified: Secondary | ICD-10-CM | POA: Diagnosis not present

## 2020-04-03 DIAGNOSIS — I10 Essential (primary) hypertension: Secondary | ICD-10-CM | POA: Diagnosis not present

## 2020-04-03 DIAGNOSIS — E785 Hyperlipidemia, unspecified: Secondary | ICD-10-CM | POA: Diagnosis not present

## 2020-04-03 DIAGNOSIS — Z6838 Body mass index (BMI) 38.0-38.9, adult: Secondary | ICD-10-CM | POA: Diagnosis not present

## 2020-04-03 DIAGNOSIS — M5136 Other intervertebral disc degeneration, lumbar region: Secondary | ICD-10-CM | POA: Diagnosis not present

## 2020-04-03 DIAGNOSIS — E118 Type 2 diabetes mellitus with unspecified complications: Secondary | ICD-10-CM | POA: Diagnosis not present

## 2020-04-03 DIAGNOSIS — Z125 Encounter for screening for malignant neoplasm of prostate: Secondary | ICD-10-CM | POA: Diagnosis not present

## 2020-04-03 DIAGNOSIS — M5431 Sciatica, right side: Secondary | ICD-10-CM | POA: Diagnosis not present

## 2020-07-14 DIAGNOSIS — J019 Acute sinusitis, unspecified: Secondary | ICD-10-CM | POA: Diagnosis not present

## 2020-08-01 DIAGNOSIS — Z6838 Body mass index (BMI) 38.0-38.9, adult: Secondary | ICD-10-CM | POA: Diagnosis not present

## 2020-08-01 DIAGNOSIS — J309 Allergic rhinitis, unspecified: Secondary | ICD-10-CM | POA: Diagnosis not present

## 2020-08-01 DIAGNOSIS — I10 Essential (primary) hypertension: Secondary | ICD-10-CM | POA: Diagnosis not present

## 2020-08-01 DIAGNOSIS — E118 Type 2 diabetes mellitus with unspecified complications: Secondary | ICD-10-CM | POA: Diagnosis not present

## 2020-08-01 DIAGNOSIS — M5136 Other intervertebral disc degeneration, lumbar region: Secondary | ICD-10-CM | POA: Diagnosis not present

## 2020-08-01 DIAGNOSIS — Z794 Long term (current) use of insulin: Secondary | ICD-10-CM | POA: Diagnosis not present

## 2020-08-01 DIAGNOSIS — E785 Hyperlipidemia, unspecified: Secondary | ICD-10-CM | POA: Diagnosis not present

## 2020-12-01 DIAGNOSIS — I10 Essential (primary) hypertension: Secondary | ICD-10-CM | POA: Diagnosis not present

## 2020-12-01 DIAGNOSIS — R0600 Dyspnea, unspecified: Secondary | ICD-10-CM | POA: Diagnosis not present

## 2020-12-01 DIAGNOSIS — E118 Type 2 diabetes mellitus with unspecified complications: Secondary | ICD-10-CM | POA: Diagnosis not present

## 2020-12-01 DIAGNOSIS — M5136 Other intervertebral disc degeneration, lumbar region: Secondary | ICD-10-CM | POA: Diagnosis not present

## 2020-12-01 DIAGNOSIS — G4733 Obstructive sleep apnea (adult) (pediatric): Secondary | ICD-10-CM | POA: Diagnosis not present

## 2020-12-01 DIAGNOSIS — E785 Hyperlipidemia, unspecified: Secondary | ICD-10-CM | POA: Diagnosis not present

## 2020-12-01 DIAGNOSIS — J309 Allergic rhinitis, unspecified: Secondary | ICD-10-CM | POA: Diagnosis not present

## 2020-12-01 DIAGNOSIS — Z6838 Body mass index (BMI) 38.0-38.9, adult: Secondary | ICD-10-CM | POA: Diagnosis not present

## 2021-01-15 DIAGNOSIS — R059 Cough, unspecified: Secondary | ICD-10-CM | POA: Diagnosis not present

## 2021-01-15 DIAGNOSIS — J189 Pneumonia, unspecified organism: Secondary | ICD-10-CM | POA: Diagnosis not present

## 2021-01-15 DIAGNOSIS — R918 Other nonspecific abnormal finding of lung field: Secondary | ICD-10-CM | POA: Diagnosis not present

## 2021-01-15 DIAGNOSIS — I2510 Atherosclerotic heart disease of native coronary artery without angina pectoris: Secondary | ICD-10-CM | POA: Diagnosis not present

## 2021-01-15 DIAGNOSIS — R0602 Shortness of breath: Secondary | ICD-10-CM | POA: Diagnosis not present

## 2021-01-15 DIAGNOSIS — J440 Chronic obstructive pulmonary disease with acute lower respiratory infection: Secondary | ICD-10-CM | POA: Diagnosis not present

## 2021-01-15 DIAGNOSIS — K760 Fatty (change of) liver, not elsewhere classified: Secondary | ICD-10-CM | POA: Diagnosis not present

## 2021-01-15 DIAGNOSIS — J9819 Other pulmonary collapse: Secondary | ICD-10-CM | POA: Diagnosis not present

## 2021-01-15 DIAGNOSIS — I700 Atherosclerosis of aorta: Secondary | ICD-10-CM | POA: Diagnosis not present

## 2021-01-15 DIAGNOSIS — R0902 Hypoxemia: Secondary | ICD-10-CM | POA: Diagnosis not present

## 2021-01-15 DIAGNOSIS — R911 Solitary pulmonary nodule: Secondary | ICD-10-CM | POA: Diagnosis not present

## 2021-01-15 DIAGNOSIS — K8020 Calculus of gallbladder without cholecystitis without obstruction: Secondary | ICD-10-CM | POA: Diagnosis not present

## 2021-01-15 DIAGNOSIS — J9809 Other diseases of bronchus, not elsewhere classified: Secondary | ICD-10-CM | POA: Diagnosis not present

## 2021-04-13 DIAGNOSIS — Z139 Encounter for screening, unspecified: Secondary | ICD-10-CM | POA: Diagnosis not present

## 2021-04-13 DIAGNOSIS — E785 Hyperlipidemia, unspecified: Secondary | ICD-10-CM | POA: Diagnosis not present

## 2021-04-13 DIAGNOSIS — Z1331 Encounter for screening for depression: Secondary | ICD-10-CM | POA: Diagnosis not present

## 2021-04-13 DIAGNOSIS — Z0000 Encounter for general adult medical examination without abnormal findings: Secondary | ICD-10-CM | POA: Diagnosis not present

## 2021-04-13 DIAGNOSIS — E669 Obesity, unspecified: Secondary | ICD-10-CM | POA: Diagnosis not present

## 2021-04-13 DIAGNOSIS — Z9181 History of falling: Secondary | ICD-10-CM | POA: Diagnosis not present

## 2021-04-13 DIAGNOSIS — Z6838 Body mass index (BMI) 38.0-38.9, adult: Secondary | ICD-10-CM | POA: Diagnosis not present

## 2021-06-28 DIAGNOSIS — I700 Atherosclerosis of aorta: Secondary | ICD-10-CM | POA: Diagnosis not present

## 2021-06-28 DIAGNOSIS — I110 Hypertensive heart disease with heart failure: Secondary | ICD-10-CM | POA: Diagnosis not present

## 2021-06-28 DIAGNOSIS — R001 Bradycardia, unspecified: Secondary | ICD-10-CM | POA: Diagnosis not present

## 2021-06-28 DIAGNOSIS — I4891 Unspecified atrial fibrillation: Secondary | ICD-10-CM | POA: Diagnosis not present

## 2021-06-28 DIAGNOSIS — I509 Heart failure, unspecified: Secondary | ICD-10-CM | POA: Diagnosis not present

## 2021-06-28 DIAGNOSIS — I959 Hypotension, unspecified: Secondary | ICD-10-CM | POA: Diagnosis not present

## 2021-06-28 DIAGNOSIS — R601 Generalized edema: Secondary | ICD-10-CM | POA: Diagnosis not present

## 2021-06-28 DIAGNOSIS — M7989 Other specified soft tissue disorders: Secondary | ICD-10-CM | POA: Diagnosis not present

## 2021-06-28 DIAGNOSIS — R918 Other nonspecific abnormal finding of lung field: Secondary | ICD-10-CM | POA: Diagnosis not present

## 2021-06-28 DIAGNOSIS — R609 Edema, unspecified: Secondary | ICD-10-CM | POA: Diagnosis not present

## 2021-06-28 DIAGNOSIS — I517 Cardiomegaly: Secondary | ICD-10-CM | POA: Diagnosis not present

## 2021-06-29 DIAGNOSIS — I110 Hypertensive heart disease with heart failure: Secondary | ICD-10-CM | POA: Diagnosis not present

## 2021-06-29 DIAGNOSIS — G4733 Obstructive sleep apnea (adult) (pediatric): Secondary | ICD-10-CM | POA: Diagnosis not present

## 2021-06-29 DIAGNOSIS — R601 Generalized edema: Secondary | ICD-10-CM | POA: Diagnosis not present

## 2021-06-29 DIAGNOSIS — I5031 Acute diastolic (congestive) heart failure: Secondary | ICD-10-CM | POA: Diagnosis not present

## 2021-06-29 DIAGNOSIS — I441 Atrioventricular block, second degree: Secondary | ICD-10-CM | POA: Diagnosis not present

## 2021-06-29 DIAGNOSIS — N170 Acute kidney failure with tubular necrosis: Secondary | ICD-10-CM | POA: Diagnosis not present

## 2021-06-29 DIAGNOSIS — E871 Hypo-osmolality and hyponatremia: Secondary | ICD-10-CM | POA: Diagnosis not present

## 2021-06-29 DIAGNOSIS — I340 Nonrheumatic mitral (valve) insufficiency: Secondary | ICD-10-CM | POA: Diagnosis not present

## 2021-06-29 DIAGNOSIS — R0603 Acute respiratory distress: Secondary | ICD-10-CM | POA: Diagnosis not present

## 2021-06-29 DIAGNOSIS — I5033 Acute on chronic diastolic (congestive) heart failure: Secondary | ICD-10-CM | POA: Diagnosis not present

## 2021-06-29 DIAGNOSIS — J189 Pneumonia, unspecified organism: Secondary | ICD-10-CM | POA: Diagnosis not present

## 2021-06-29 DIAGNOSIS — R001 Bradycardia, unspecified: Secondary | ICD-10-CM | POA: Diagnosis not present

## 2021-06-29 DIAGNOSIS — J440 Chronic obstructive pulmonary disease with acute lower respiratory infection: Secondary | ICD-10-CM | POA: Diagnosis not present

## 2021-06-29 DIAGNOSIS — E785 Hyperlipidemia, unspecified: Secondary | ICD-10-CM | POA: Diagnosis not present

## 2021-06-29 DIAGNOSIS — I700 Atherosclerosis of aorta: Secondary | ICD-10-CM | POA: Diagnosis not present

## 2021-06-29 DIAGNOSIS — N3289 Other specified disorders of bladder: Secondary | ICD-10-CM | POA: Diagnosis not present

## 2021-06-29 DIAGNOSIS — I5032 Chronic diastolic (congestive) heart failure: Secondary | ICD-10-CM | POA: Diagnosis not present

## 2021-06-29 DIAGNOSIS — Z7401 Bed confinement status: Secondary | ICD-10-CM | POA: Diagnosis not present

## 2021-06-29 DIAGNOSIS — R918 Other nonspecific abnormal finding of lung field: Secondary | ICD-10-CM | POA: Diagnosis not present

## 2021-06-29 DIAGNOSIS — J441 Chronic obstructive pulmonary disease with (acute) exacerbation: Secondary | ICD-10-CM | POA: Diagnosis not present

## 2021-06-29 DIAGNOSIS — I10 Essential (primary) hypertension: Secondary | ICD-10-CM | POA: Diagnosis not present

## 2021-06-29 DIAGNOSIS — I509 Heart failure, unspecified: Secondary | ICD-10-CM | POA: Diagnosis not present

## 2021-06-29 DIAGNOSIS — I517 Cardiomegaly: Secondary | ICD-10-CM | POA: Diagnosis not present

## 2021-06-29 DIAGNOSIS — J9600 Acute respiratory failure, unspecified whether with hypoxia or hypercapnia: Secondary | ICD-10-CM | POA: Diagnosis not present

## 2021-06-29 DIAGNOSIS — M7989 Other specified soft tissue disorders: Secondary | ICD-10-CM | POA: Diagnosis not present

## 2021-06-29 DIAGNOSIS — E119 Type 2 diabetes mellitus without complications: Secondary | ICD-10-CM | POA: Diagnosis not present

## 2021-06-29 DIAGNOSIS — R609 Edema, unspecified: Secondary | ICD-10-CM | POA: Diagnosis not present

## 2021-06-29 DIAGNOSIS — E872 Acidosis, unspecified: Secondary | ICD-10-CM | POA: Diagnosis not present

## 2021-06-30 DIAGNOSIS — N3289 Other specified disorders of bladder: Secondary | ICD-10-CM | POA: Diagnosis not present

## 2021-06-30 DIAGNOSIS — I110 Hypertensive heart disease with heart failure: Secondary | ICD-10-CM

## 2021-06-30 DIAGNOSIS — I5032 Chronic diastolic (congestive) heart failure: Secondary | ICD-10-CM

## 2021-06-30 DIAGNOSIS — I5033 Acute on chronic diastolic (congestive) heart failure: Secondary | ICD-10-CM | POA: Diagnosis not present

## 2021-06-30 DIAGNOSIS — I441 Atrioventricular block, second degree: Secondary | ICD-10-CM

## 2021-06-30 DIAGNOSIS — N170 Acute kidney failure with tubular necrosis: Secondary | ICD-10-CM | POA: Diagnosis not present

## 2021-06-30 DIAGNOSIS — R001 Bradycardia, unspecified: Secondary | ICD-10-CM

## 2021-06-30 DIAGNOSIS — G4733 Obstructive sleep apnea (adult) (pediatric): Secondary | ICD-10-CM

## 2021-07-01 DIAGNOSIS — R001 Bradycardia, unspecified: Secondary | ICD-10-CM | POA: Diagnosis not present

## 2021-07-01 DIAGNOSIS — I441 Atrioventricular block, second degree: Secondary | ICD-10-CM | POA: Diagnosis not present

## 2021-07-01 DIAGNOSIS — I5033 Acute on chronic diastolic (congestive) heart failure: Secondary | ICD-10-CM | POA: Diagnosis not present

## 2021-07-01 DIAGNOSIS — I5032 Chronic diastolic (congestive) heart failure: Secondary | ICD-10-CM | POA: Diagnosis not present

## 2021-07-01 DIAGNOSIS — I110 Hypertensive heart disease with heart failure: Secondary | ICD-10-CM | POA: Diagnosis not present

## 2021-07-01 DIAGNOSIS — N170 Acute kidney failure with tubular necrosis: Secondary | ICD-10-CM | POA: Diagnosis not present

## 2021-07-02 DIAGNOSIS — I5031 Acute diastolic (congestive) heart failure: Secondary | ICD-10-CM

## 2021-07-02 DIAGNOSIS — I441 Atrioventricular block, second degree: Secondary | ICD-10-CM

## 2021-07-02 DIAGNOSIS — I5033 Acute on chronic diastolic (congestive) heart failure: Secondary | ICD-10-CM | POA: Diagnosis not present

## 2021-07-02 DIAGNOSIS — R001 Bradycardia, unspecified: Secondary | ICD-10-CM

## 2021-07-02 DIAGNOSIS — I10 Essential (primary) hypertension: Secondary | ICD-10-CM

## 2021-07-02 DIAGNOSIS — N170 Acute kidney failure with tubular necrosis: Secondary | ICD-10-CM | POA: Diagnosis not present

## 2021-07-02 DIAGNOSIS — I110 Hypertensive heart disease with heart failure: Secondary | ICD-10-CM | POA: Diagnosis not present

## 2021-07-02 DIAGNOSIS — E785 Hyperlipidemia, unspecified: Secondary | ICD-10-CM

## 2021-07-03 DIAGNOSIS — I441 Atrioventricular block, second degree: Secondary | ICD-10-CM

## 2021-07-03 DIAGNOSIS — R001 Bradycardia, unspecified: Secondary | ICD-10-CM | POA: Diagnosis not present

## 2021-07-03 DIAGNOSIS — E785 Hyperlipidemia, unspecified: Secondary | ICD-10-CM | POA: Diagnosis not present

## 2021-07-03 DIAGNOSIS — I5033 Acute on chronic diastolic (congestive) heart failure: Secondary | ICD-10-CM | POA: Diagnosis not present

## 2021-07-03 DIAGNOSIS — I5031 Acute diastolic (congestive) heart failure: Secondary | ICD-10-CM | POA: Diagnosis not present

## 2021-07-03 DIAGNOSIS — N170 Acute kidney failure with tubular necrosis: Secondary | ICD-10-CM | POA: Diagnosis not present

## 2021-07-03 DIAGNOSIS — N3289 Other specified disorders of bladder: Secondary | ICD-10-CM | POA: Diagnosis not present

## 2021-07-03 DIAGNOSIS — I10 Essential (primary) hypertension: Secondary | ICD-10-CM | POA: Diagnosis not present

## 2021-07-03 DIAGNOSIS — I110 Hypertensive heart disease with heart failure: Secondary | ICD-10-CM | POA: Diagnosis not present

## 2021-07-04 DIAGNOSIS — I441 Atrioventricular block, second degree: Secondary | ICD-10-CM | POA: Diagnosis not present

## 2021-07-04 DIAGNOSIS — I5031 Acute diastolic (congestive) heart failure: Secondary | ICD-10-CM | POA: Diagnosis not present

## 2021-07-04 DIAGNOSIS — E785 Hyperlipidemia, unspecified: Secondary | ICD-10-CM | POA: Diagnosis not present

## 2021-07-04 DIAGNOSIS — I5033 Acute on chronic diastolic (congestive) heart failure: Secondary | ICD-10-CM | POA: Diagnosis not present

## 2021-07-04 DIAGNOSIS — I110 Hypertensive heart disease with heart failure: Secondary | ICD-10-CM | POA: Diagnosis not present

## 2021-07-04 DIAGNOSIS — I10 Essential (primary) hypertension: Secondary | ICD-10-CM | POA: Diagnosis not present

## 2021-07-04 DIAGNOSIS — N170 Acute kidney failure with tubular necrosis: Secondary | ICD-10-CM | POA: Diagnosis not present

## 2021-07-05 DIAGNOSIS — I10 Essential (primary) hypertension: Secondary | ICD-10-CM | POA: Diagnosis not present

## 2021-07-05 DIAGNOSIS — I441 Atrioventricular block, second degree: Secondary | ICD-10-CM | POA: Diagnosis not present

## 2021-07-05 DIAGNOSIS — I110 Hypertensive heart disease with heart failure: Secondary | ICD-10-CM | POA: Diagnosis not present

## 2021-07-05 DIAGNOSIS — I5031 Acute diastolic (congestive) heart failure: Secondary | ICD-10-CM | POA: Diagnosis not present

## 2021-07-05 DIAGNOSIS — N170 Acute kidney failure with tubular necrosis: Secondary | ICD-10-CM | POA: Diagnosis not present

## 2021-07-05 DIAGNOSIS — E785 Hyperlipidemia, unspecified: Secondary | ICD-10-CM | POA: Diagnosis not present

## 2021-07-05 DIAGNOSIS — I5033 Acute on chronic diastolic (congestive) heart failure: Secondary | ICD-10-CM | POA: Diagnosis not present

## 2021-07-06 DIAGNOSIS — I5031 Acute diastolic (congestive) heart failure: Secondary | ICD-10-CM | POA: Diagnosis not present

## 2021-07-06 DIAGNOSIS — I110 Hypertensive heart disease with heart failure: Secondary | ICD-10-CM | POA: Diagnosis not present

## 2021-07-06 DIAGNOSIS — I5033 Acute on chronic diastolic (congestive) heart failure: Secondary | ICD-10-CM | POA: Diagnosis not present

## 2021-07-06 DIAGNOSIS — R001 Bradycardia, unspecified: Secondary | ICD-10-CM | POA: Diagnosis not present

## 2021-07-06 DIAGNOSIS — J441 Chronic obstructive pulmonary disease with (acute) exacerbation: Secondary | ICD-10-CM | POA: Diagnosis not present

## 2021-07-06 DIAGNOSIS — N170 Acute kidney failure with tubular necrosis: Secondary | ICD-10-CM | POA: Diagnosis not present

## 2021-07-06 DIAGNOSIS — I10 Essential (primary) hypertension: Secondary | ICD-10-CM | POA: Diagnosis not present

## 2021-07-06 DIAGNOSIS — E119 Type 2 diabetes mellitus without complications: Secondary | ICD-10-CM

## 2021-07-07 DIAGNOSIS — N170 Acute kidney failure with tubular necrosis: Secondary | ICD-10-CM | POA: Diagnosis not present

## 2021-07-07 DIAGNOSIS — I5032 Chronic diastolic (congestive) heart failure: Secondary | ICD-10-CM | POA: Diagnosis not present

## 2021-07-07 DIAGNOSIS — I5033 Acute on chronic diastolic (congestive) heart failure: Secondary | ICD-10-CM | POA: Diagnosis not present

## 2021-07-07 DIAGNOSIS — I110 Hypertensive heart disease with heart failure: Secondary | ICD-10-CM | POA: Diagnosis not present

## 2021-07-07 DIAGNOSIS — G4733 Obstructive sleep apnea (adult) (pediatric): Secondary | ICD-10-CM | POA: Diagnosis not present

## 2021-07-07 DIAGNOSIS — R001 Bradycardia, unspecified: Secondary | ICD-10-CM | POA: Diagnosis not present

## 2021-07-08 DIAGNOSIS — I5033 Acute on chronic diastolic (congestive) heart failure: Secondary | ICD-10-CM | POA: Diagnosis not present

## 2021-07-08 DIAGNOSIS — N170 Acute kidney failure with tubular necrosis: Secondary | ICD-10-CM | POA: Diagnosis not present

## 2021-07-08 DIAGNOSIS — I110 Hypertensive heart disease with heart failure: Secondary | ICD-10-CM | POA: Diagnosis not present

## 2021-07-09 DIAGNOSIS — I5033 Acute on chronic diastolic (congestive) heart failure: Secondary | ICD-10-CM | POA: Diagnosis not present

## 2021-07-09 DIAGNOSIS — I110 Hypertensive heart disease with heart failure: Secondary | ICD-10-CM | POA: Diagnosis not present

## 2021-07-09 DIAGNOSIS — N170 Acute kidney failure with tubular necrosis: Secondary | ICD-10-CM | POA: Diagnosis not present

## 2021-07-10 DIAGNOSIS — I5033 Acute on chronic diastolic (congestive) heart failure: Secondary | ICD-10-CM | POA: Diagnosis not present

## 2021-07-10 DIAGNOSIS — N170 Acute kidney failure with tubular necrosis: Secondary | ICD-10-CM | POA: Diagnosis not present

## 2021-07-10 DIAGNOSIS — I110 Hypertensive heart disease with heart failure: Secondary | ICD-10-CM | POA: Diagnosis not present

## 2021-07-11 DIAGNOSIS — I5033 Acute on chronic diastolic (congestive) heart failure: Secondary | ICD-10-CM | POA: Diagnosis not present

## 2021-07-11 DIAGNOSIS — I110 Hypertensive heart disease with heart failure: Secondary | ICD-10-CM | POA: Diagnosis not present

## 2021-07-11 DIAGNOSIS — N170 Acute kidney failure with tubular necrosis: Secondary | ICD-10-CM | POA: Diagnosis not present

## 2021-07-11 DIAGNOSIS — R0603 Acute respiratory distress: Secondary | ICD-10-CM | POA: Diagnosis not present

## 2021-07-12 DIAGNOSIS — I5033 Acute on chronic diastolic (congestive) heart failure: Secondary | ICD-10-CM | POA: Diagnosis not present

## 2021-07-12 DIAGNOSIS — N170 Acute kidney failure with tubular necrosis: Secondary | ICD-10-CM | POA: Diagnosis not present

## 2021-07-12 DIAGNOSIS — I110 Hypertensive heart disease with heart failure: Secondary | ICD-10-CM | POA: Diagnosis not present

## 2021-07-13 DIAGNOSIS — E1159 Type 2 diabetes mellitus with other circulatory complications: Secondary | ICD-10-CM | POA: Diagnosis not present

## 2021-07-13 DIAGNOSIS — M7531 Calcific tendinitis of right shoulder: Secondary | ICD-10-CM | POA: Diagnosis not present

## 2021-07-13 DIAGNOSIS — J9600 Acute respiratory failure, unspecified whether with hypoxia or hypercapnia: Secondary | ICD-10-CM | POA: Diagnosis not present

## 2021-07-13 DIAGNOSIS — R609 Edema, unspecified: Secondary | ICD-10-CM | POA: Diagnosis not present

## 2021-07-13 DIAGNOSIS — R5381 Other malaise: Secondary | ICD-10-CM | POA: Diagnosis not present

## 2021-07-13 DIAGNOSIS — R1084 Generalized abdominal pain: Secondary | ICD-10-CM | POA: Diagnosis not present

## 2021-07-13 DIAGNOSIS — N3289 Other specified disorders of bladder: Secondary | ICD-10-CM | POA: Diagnosis not present

## 2021-07-13 DIAGNOSIS — J9602 Acute respiratory failure with hypercapnia: Secondary | ICD-10-CM | POA: Diagnosis not present

## 2021-07-13 DIAGNOSIS — R601 Generalized edema: Secondary | ICD-10-CM | POA: Diagnosis not present

## 2021-07-13 DIAGNOSIS — H1010 Acute atopic conjunctivitis, unspecified eye: Secondary | ICD-10-CM | POA: Diagnosis not present

## 2021-07-13 DIAGNOSIS — I5032 Chronic diastolic (congestive) heart failure: Secondary | ICD-10-CM | POA: Diagnosis not present

## 2021-07-13 DIAGNOSIS — M542 Cervicalgia: Secondary | ICD-10-CM | POA: Diagnosis not present

## 2021-07-13 DIAGNOSIS — I152 Hypertension secondary to endocrine disorders: Secondary | ICD-10-CM | POA: Diagnosis not present

## 2021-07-13 DIAGNOSIS — J159 Unspecified bacterial pneumonia: Secondary | ICD-10-CM | POA: Diagnosis not present

## 2021-07-13 DIAGNOSIS — T83511A Infection and inflammatory reaction due to indwelling urethral catheter, initial encounter: Secondary | ICD-10-CM | POA: Diagnosis not present

## 2021-07-13 DIAGNOSIS — R300 Dysuria: Secondary | ICD-10-CM | POA: Diagnosis not present

## 2021-07-13 DIAGNOSIS — I4891 Unspecified atrial fibrillation: Secondary | ICD-10-CM | POA: Diagnosis not present

## 2021-07-13 DIAGNOSIS — N3001 Acute cystitis with hematuria: Secondary | ICD-10-CM | POA: Diagnosis not present

## 2021-07-13 DIAGNOSIS — R339 Retention of urine, unspecified: Secondary | ICD-10-CM | POA: Diagnosis not present

## 2021-07-13 DIAGNOSIS — I110 Hypertensive heart disease with heart failure: Secondary | ICD-10-CM | POA: Diagnosis not present

## 2021-07-13 DIAGNOSIS — J9601 Acute respiratory failure with hypoxia: Secondary | ICD-10-CM | POA: Diagnosis not present

## 2021-07-13 DIAGNOSIS — R109 Unspecified abdominal pain: Secondary | ICD-10-CM | POA: Diagnosis not present

## 2021-07-13 DIAGNOSIS — E8729 Other acidosis: Secondary | ICD-10-CM | POA: Diagnosis not present

## 2021-07-13 DIAGNOSIS — N390 Urinary tract infection, site not specified: Secondary | ICD-10-CM | POA: Diagnosis not present

## 2021-07-13 DIAGNOSIS — R079 Chest pain, unspecified: Secondary | ICD-10-CM | POA: Diagnosis not present

## 2021-07-13 DIAGNOSIS — N179 Acute kidney failure, unspecified: Secondary | ICD-10-CM | POA: Diagnosis not present

## 2021-07-13 DIAGNOSIS — K8020 Calculus of gallbladder without cholecystitis without obstruction: Secondary | ICD-10-CM | POA: Diagnosis not present

## 2021-07-13 DIAGNOSIS — I5033 Acute on chronic diastolic (congestive) heart failure: Secondary | ICD-10-CM | POA: Diagnosis not present

## 2021-07-13 DIAGNOSIS — K5730 Diverticulosis of large intestine without perforation or abscess without bleeding: Secondary | ICD-10-CM | POA: Diagnosis not present

## 2021-07-13 DIAGNOSIS — Z7401 Bed confinement status: Secondary | ICD-10-CM | POA: Diagnosis not present

## 2021-07-13 DIAGNOSIS — E43 Unspecified severe protein-calorie malnutrition: Secondary | ICD-10-CM | POA: Diagnosis not present

## 2021-07-13 DIAGNOSIS — Z1611 Resistance to penicillins: Secondary | ICD-10-CM | POA: Diagnosis not present

## 2021-07-13 DIAGNOSIS — R140 Abdominal distension (gaseous): Secondary | ICD-10-CM | POA: Diagnosis not present

## 2021-07-13 DIAGNOSIS — N170 Acute kidney failure with tubular necrosis: Secondary | ICD-10-CM | POA: Diagnosis not present

## 2021-07-13 DIAGNOSIS — M25511 Pain in right shoulder: Secondary | ICD-10-CM | POA: Diagnosis not present

## 2021-07-18 DIAGNOSIS — N390 Urinary tract infection, site not specified: Secondary | ICD-10-CM | POA: Diagnosis not present

## 2021-07-18 DIAGNOSIS — E1159 Type 2 diabetes mellitus with other circulatory complications: Secondary | ICD-10-CM | POA: Diagnosis not present

## 2021-07-18 DIAGNOSIS — I152 Hypertension secondary to endocrine disorders: Secondary | ICD-10-CM | POA: Diagnosis not present

## 2021-07-18 DIAGNOSIS — R5381 Other malaise: Secondary | ICD-10-CM | POA: Diagnosis not present

## 2021-07-18 DIAGNOSIS — I5032 Chronic diastolic (congestive) heart failure: Secondary | ICD-10-CM | POA: Diagnosis not present

## 2021-07-20 DIAGNOSIS — H1010 Acute atopic conjunctivitis, unspecified eye: Secondary | ICD-10-CM | POA: Diagnosis not present

## 2021-07-20 DIAGNOSIS — R339 Retention of urine, unspecified: Secondary | ICD-10-CM | POA: Diagnosis not present

## 2021-07-20 DIAGNOSIS — R5381 Other malaise: Secondary | ICD-10-CM | POA: Diagnosis not present

## 2021-07-23 DIAGNOSIS — R109 Unspecified abdominal pain: Secondary | ICD-10-CM | POA: Diagnosis not present

## 2021-07-23 DIAGNOSIS — N390 Urinary tract infection, site not specified: Secondary | ICD-10-CM | POA: Diagnosis not present

## 2021-07-23 DIAGNOSIS — N170 Acute kidney failure with tubular necrosis: Secondary | ICD-10-CM | POA: Diagnosis not present

## 2021-07-23 DIAGNOSIS — I5033 Acute on chronic diastolic (congestive) heart failure: Secondary | ICD-10-CM | POA: Diagnosis not present

## 2021-07-23 DIAGNOSIS — J9601 Acute respiratory failure with hypoxia: Secondary | ICD-10-CM | POA: Diagnosis not present

## 2021-07-23 DIAGNOSIS — R0600 Dyspnea, unspecified: Secondary | ICD-10-CM | POA: Diagnosis not present

## 2021-07-23 DIAGNOSIS — R300 Dysuria: Secondary | ICD-10-CM | POA: Diagnosis not present

## 2021-07-23 DIAGNOSIS — Z1611 Resistance to penicillins: Secondary | ICD-10-CM | POA: Diagnosis not present

## 2021-07-23 DIAGNOSIS — I10 Essential (primary) hypertension: Secondary | ICD-10-CM | POA: Diagnosis not present

## 2021-07-23 DIAGNOSIS — L89620 Pressure ulcer of left heel, unstageable: Secondary | ICD-10-CM | POA: Diagnosis not present

## 2021-07-23 DIAGNOSIS — E8729 Other acidosis: Secondary | ICD-10-CM | POA: Diagnosis not present

## 2021-07-23 DIAGNOSIS — R0602 Shortness of breath: Secondary | ICD-10-CM | POA: Diagnosis not present

## 2021-07-23 DIAGNOSIS — R918 Other nonspecific abnormal finding of lung field: Secondary | ICD-10-CM | POA: Diagnosis not present

## 2021-07-23 DIAGNOSIS — E119 Type 2 diabetes mellitus without complications: Secondary | ICD-10-CM | POA: Diagnosis not present

## 2021-07-23 DIAGNOSIS — D509 Iron deficiency anemia, unspecified: Secondary | ICD-10-CM | POA: Diagnosis not present

## 2021-07-23 DIAGNOSIS — J9811 Atelectasis: Secondary | ICD-10-CM | POA: Diagnosis not present

## 2021-07-23 DIAGNOSIS — J90 Pleural effusion, not elsewhere classified: Secondary | ICD-10-CM | POA: Diagnosis not present

## 2021-07-23 DIAGNOSIS — J984 Other disorders of lung: Secondary | ICD-10-CM | POA: Diagnosis not present

## 2021-07-23 DIAGNOSIS — D72829 Elevated white blood cell count, unspecified: Secondary | ICD-10-CM | POA: Diagnosis not present

## 2021-07-23 DIAGNOSIS — J159 Unspecified bacterial pneumonia: Secondary | ICD-10-CM | POA: Diagnosis not present

## 2021-07-23 DIAGNOSIS — G4733 Obstructive sleep apnea (adult) (pediatric): Secondary | ICD-10-CM | POA: Diagnosis not present

## 2021-07-23 DIAGNOSIS — R079 Chest pain, unspecified: Secondary | ICD-10-CM | POA: Diagnosis not present

## 2021-07-23 DIAGNOSIS — K5730 Diverticulosis of large intestine without perforation or abscess without bleeding: Secondary | ICD-10-CM | POA: Diagnosis not present

## 2021-07-23 DIAGNOSIS — J9602 Acute respiratory failure with hypercapnia: Secondary | ICD-10-CM | POA: Diagnosis not present

## 2021-07-23 DIAGNOSIS — A419 Sepsis, unspecified organism: Secondary | ICD-10-CM | POA: Diagnosis not present

## 2021-07-23 DIAGNOSIS — R140 Abdominal distension (gaseous): Secondary | ICD-10-CM | POA: Diagnosis not present

## 2021-07-23 DIAGNOSIS — R1084 Generalized abdominal pain: Secondary | ICD-10-CM | POA: Diagnosis not present

## 2021-07-23 DIAGNOSIS — J189 Pneumonia, unspecified organism: Secondary | ICD-10-CM | POA: Diagnosis not present

## 2021-07-23 DIAGNOSIS — I5023 Acute on chronic systolic (congestive) heart failure: Secondary | ICD-10-CM | POA: Diagnosis not present

## 2021-07-23 DIAGNOSIS — N179 Acute kidney failure, unspecified: Secondary | ICD-10-CM | POA: Diagnosis not present

## 2021-07-23 DIAGNOSIS — I480 Paroxysmal atrial fibrillation: Secondary | ICD-10-CM | POA: Diagnosis not present

## 2021-07-23 DIAGNOSIS — E785 Hyperlipidemia, unspecified: Secondary | ICD-10-CM | POA: Diagnosis not present

## 2021-07-23 DIAGNOSIS — K8020 Calculus of gallbladder without cholecystitis without obstruction: Secondary | ICD-10-CM | POA: Diagnosis not present

## 2021-07-23 DIAGNOSIS — I4891 Unspecified atrial fibrillation: Secondary | ICD-10-CM | POA: Diagnosis not present

## 2021-07-23 DIAGNOSIS — T83511A Infection and inflammatory reaction due to indwelling urethral catheter, initial encounter: Secondary | ICD-10-CM | POA: Diagnosis not present

## 2021-07-23 DIAGNOSIS — E43 Unspecified severe protein-calorie malnutrition: Secondary | ICD-10-CM | POA: Diagnosis not present

## 2021-07-23 DIAGNOSIS — N3289 Other specified disorders of bladder: Secondary | ICD-10-CM | POA: Diagnosis not present

## 2021-07-23 DIAGNOSIS — N3001 Acute cystitis with hematuria: Secondary | ICD-10-CM | POA: Diagnosis not present

## 2021-07-23 DIAGNOSIS — Z7401 Bed confinement status: Secondary | ICD-10-CM | POA: Diagnosis not present

## 2021-07-23 DIAGNOSIS — R0902 Hypoxemia: Secondary | ICD-10-CM | POA: Diagnosis not present

## 2021-07-23 DIAGNOSIS — E876 Hypokalemia: Secondary | ICD-10-CM | POA: Diagnosis not present

## 2021-07-23 DIAGNOSIS — L89310 Pressure ulcer of right buttock, unstageable: Secondary | ICD-10-CM | POA: Diagnosis not present

## 2021-07-26 DIAGNOSIS — I4891 Unspecified atrial fibrillation: Secondary | ICD-10-CM

## 2021-08-14 DIAGNOSIS — E876 Hypokalemia: Secondary | ICD-10-CM | POA: Diagnosis not present

## 2021-08-14 DIAGNOSIS — T83511A Infection and inflammatory reaction due to indwelling urethral catheter, initial encounter: Secondary | ICD-10-CM | POA: Diagnosis not present

## 2021-08-14 DIAGNOSIS — R1013 Epigastric pain: Secondary | ICD-10-CM | POA: Diagnosis not present

## 2021-08-14 DIAGNOSIS — R0902 Hypoxemia: Secondary | ICD-10-CM | POA: Diagnosis not present

## 2021-08-14 DIAGNOSIS — K760 Fatty (change of) liver, not elsewhere classified: Secondary | ICD-10-CM | POA: Diagnosis not present

## 2021-08-14 DIAGNOSIS — E119 Type 2 diabetes mellitus without complications: Secondary | ICD-10-CM | POA: Diagnosis not present

## 2021-08-14 DIAGNOSIS — D509 Iron deficiency anemia, unspecified: Secondary | ICD-10-CM | POA: Diagnosis not present

## 2021-08-14 DIAGNOSIS — J189 Pneumonia, unspecified organism: Secondary | ICD-10-CM | POA: Diagnosis not present

## 2021-08-14 DIAGNOSIS — R55 Syncope and collapse: Secondary | ICD-10-CM | POA: Diagnosis not present

## 2021-08-14 DIAGNOSIS — K810 Acute cholecystitis: Secondary | ICD-10-CM | POA: Diagnosis not present

## 2021-08-14 DIAGNOSIS — D7219 Other eosinophilia: Secondary | ICD-10-CM | POA: Diagnosis not present

## 2021-08-14 DIAGNOSIS — N179 Acute kidney failure, unspecified: Secondary | ICD-10-CM | POA: Diagnosis not present

## 2021-08-14 DIAGNOSIS — E785 Hyperlipidemia, unspecified: Secondary | ICD-10-CM | POA: Diagnosis not present

## 2021-08-14 DIAGNOSIS — D72829 Elevated white blood cell count, unspecified: Secondary | ICD-10-CM | POA: Diagnosis not present

## 2021-08-14 DIAGNOSIS — J159 Unspecified bacterial pneumonia: Secondary | ICD-10-CM | POA: Diagnosis not present

## 2021-08-14 DIAGNOSIS — K819 Cholecystitis, unspecified: Secondary | ICD-10-CM | POA: Diagnosis not present

## 2021-08-14 DIAGNOSIS — I480 Paroxysmal atrial fibrillation: Secondary | ICD-10-CM | POA: Diagnosis not present

## 2021-08-14 DIAGNOSIS — J9611 Chronic respiratory failure with hypoxia: Secondary | ICD-10-CM | POA: Diagnosis not present

## 2021-08-14 DIAGNOSIS — L89620 Pressure ulcer of left heel, unstageable: Secondary | ICD-10-CM | POA: Diagnosis not present

## 2021-08-14 DIAGNOSIS — K567 Ileus, unspecified: Secondary | ICD-10-CM | POA: Diagnosis not present

## 2021-08-14 DIAGNOSIS — Z8701 Personal history of pneumonia (recurrent): Secondary | ICD-10-CM | POA: Diagnosis not present

## 2021-08-14 DIAGNOSIS — N390 Urinary tract infection, site not specified: Secondary | ICD-10-CM | POA: Diagnosis not present

## 2021-08-14 DIAGNOSIS — I110 Hypertensive heart disease with heart failure: Secondary | ICD-10-CM | POA: Diagnosis not present

## 2021-08-14 DIAGNOSIS — R319 Hematuria, unspecified: Secondary | ICD-10-CM | POA: Diagnosis not present

## 2021-08-14 DIAGNOSIS — I951 Orthostatic hypotension: Secondary | ICD-10-CM | POA: Diagnosis not present

## 2021-08-14 DIAGNOSIS — E43 Unspecified severe protein-calorie malnutrition: Secondary | ICD-10-CM | POA: Diagnosis not present

## 2021-08-14 DIAGNOSIS — I509 Heart failure, unspecified: Secondary | ICD-10-CM | POA: Diagnosis not present

## 2021-08-14 DIAGNOSIS — K8067 Calculus of gallbladder and bile duct with acute and chronic cholecystitis with obstruction: Secondary | ICD-10-CM | POA: Diagnosis not present

## 2021-08-14 DIAGNOSIS — I959 Hypotension, unspecified: Secondary | ICD-10-CM | POA: Diagnosis not present

## 2021-08-14 DIAGNOSIS — L89310 Pressure ulcer of right buttock, unstageable: Secondary | ICD-10-CM | POA: Diagnosis not present

## 2021-08-14 DIAGNOSIS — G4733 Obstructive sleep apnea (adult) (pediatric): Secondary | ICD-10-CM | POA: Diagnosis not present

## 2021-08-14 DIAGNOSIS — I5032 Chronic diastolic (congestive) heart failure: Secondary | ICD-10-CM | POA: Diagnosis not present

## 2021-08-14 DIAGNOSIS — I5023 Acute on chronic systolic (congestive) heart failure: Secondary | ICD-10-CM | POA: Diagnosis not present

## 2021-08-14 DIAGNOSIS — L508 Other urticaria: Secondary | ICD-10-CM | POA: Diagnosis not present

## 2021-08-14 DIAGNOSIS — L89150 Pressure ulcer of sacral region, unstageable: Secondary | ICD-10-CM | POA: Diagnosis not present

## 2021-08-14 DIAGNOSIS — R1011 Right upper quadrant pain: Secondary | ICD-10-CM | POA: Diagnosis not present

## 2021-08-14 DIAGNOSIS — Z7401 Bed confinement status: Secondary | ICD-10-CM | POA: Diagnosis not present

## 2021-08-14 DIAGNOSIS — I10 Essential (primary) hypertension: Secondary | ICD-10-CM | POA: Diagnosis not present

## 2021-08-15 DIAGNOSIS — E876 Hypokalemia: Secondary | ICD-10-CM | POA: Diagnosis not present

## 2021-08-15 DIAGNOSIS — N179 Acute kidney failure, unspecified: Secondary | ICD-10-CM | POA: Diagnosis not present

## 2021-08-15 DIAGNOSIS — I5032 Chronic diastolic (congestive) heart failure: Secondary | ICD-10-CM | POA: Diagnosis not present

## 2021-08-15 DIAGNOSIS — E785 Hyperlipidemia, unspecified: Secondary | ICD-10-CM | POA: Diagnosis not present

## 2021-08-15 DIAGNOSIS — R319 Hematuria, unspecified: Secondary | ICD-10-CM | POA: Diagnosis not present

## 2021-08-15 DIAGNOSIS — L89310 Pressure ulcer of right buttock, unstageable: Secondary | ICD-10-CM | POA: Diagnosis not present

## 2021-08-15 DIAGNOSIS — I959 Hypotension, unspecified: Secondary | ICD-10-CM | POA: Diagnosis not present

## 2021-08-15 DIAGNOSIS — R1013 Epigastric pain: Secondary | ICD-10-CM | POA: Diagnosis not present

## 2021-08-16 DIAGNOSIS — L89310 Pressure ulcer of right buttock, unstageable: Secondary | ICD-10-CM | POA: Diagnosis not present

## 2021-08-16 DIAGNOSIS — L508 Other urticaria: Secondary | ICD-10-CM | POA: Diagnosis not present

## 2021-08-17 DIAGNOSIS — K838 Other specified diseases of biliary tract: Secondary | ICD-10-CM | POA: Diagnosis not present

## 2021-08-17 DIAGNOSIS — M7989 Other specified soft tissue disorders: Secondary | ICD-10-CM | POA: Diagnosis not present

## 2021-08-17 DIAGNOSIS — K810 Acute cholecystitis: Secondary | ICD-10-CM | POA: Diagnosis not present

## 2021-08-17 DIAGNOSIS — D72829 Elevated white blood cell count, unspecified: Secondary | ICD-10-CM | POA: Diagnosis not present

## 2021-08-17 DIAGNOSIS — K8010 Calculus of gallbladder with chronic cholecystitis without obstruction: Secondary | ICD-10-CM | POA: Diagnosis not present

## 2021-08-17 DIAGNOSIS — K8067 Calculus of gallbladder and bile duct with acute and chronic cholecystitis with obstruction: Secondary | ICD-10-CM | POA: Diagnosis not present

## 2021-08-17 DIAGNOSIS — K819 Cholecystitis, unspecified: Secondary | ICD-10-CM | POA: Diagnosis not present

## 2021-08-17 DIAGNOSIS — N39492 Postural (urinary) incontinence: Secondary | ICD-10-CM | POA: Diagnosis not present

## 2021-08-17 DIAGNOSIS — E785 Hyperlipidemia, unspecified: Secondary | ICD-10-CM | POA: Diagnosis not present

## 2021-08-17 DIAGNOSIS — E119 Type 2 diabetes mellitus without complications: Secondary | ICD-10-CM | POA: Diagnosis not present

## 2021-08-17 DIAGNOSIS — I509 Heart failure, unspecified: Secondary | ICD-10-CM | POA: Diagnosis not present

## 2021-08-17 DIAGNOSIS — I951 Orthostatic hypotension: Secondary | ICD-10-CM | POA: Diagnosis not present

## 2021-08-17 DIAGNOSIS — R7989 Other specified abnormal findings of blood chemistry: Secondary | ICD-10-CM | POA: Diagnosis not present

## 2021-08-17 DIAGNOSIS — R946 Abnormal results of thyroid function studies: Secondary | ICD-10-CM | POA: Diagnosis not present

## 2021-08-17 DIAGNOSIS — D7219 Other eosinophilia: Secondary | ICD-10-CM | POA: Diagnosis not present

## 2021-08-17 DIAGNOSIS — L98429 Non-pressure chronic ulcer of back with unspecified severity: Secondary | ICD-10-CM | POA: Diagnosis not present

## 2021-08-17 DIAGNOSIS — D649 Anemia, unspecified: Secondary | ICD-10-CM | POA: Diagnosis not present

## 2021-08-17 DIAGNOSIS — Z743 Need for continuous supervision: Secondary | ICD-10-CM | POA: Diagnosis not present

## 2021-08-17 DIAGNOSIS — Z8701 Personal history of pneumonia (recurrent): Secondary | ICD-10-CM | POA: Diagnosis not present

## 2021-08-17 DIAGNOSIS — J90 Pleural effusion, not elsewhere classified: Secondary | ICD-10-CM | POA: Diagnosis not present

## 2021-08-17 DIAGNOSIS — T361X5A Adverse effect of cephalosporins and other beta-lactam antibiotics, initial encounter: Secondary | ICD-10-CM | POA: Diagnosis not present

## 2021-08-17 DIAGNOSIS — K5730 Diverticulosis of large intestine without perforation or abscess without bleeding: Secondary | ICD-10-CM | POA: Diagnosis not present

## 2021-08-17 DIAGNOSIS — Z9049 Acquired absence of other specified parts of digestive tract: Secondary | ICD-10-CM | POA: Diagnosis not present

## 2021-08-17 DIAGNOSIS — I110 Hypertensive heart disease with heart failure: Secondary | ICD-10-CM | POA: Diagnosis not present

## 2021-08-17 DIAGNOSIS — K8063 Calculus of gallbladder and bile duct with acute cholecystitis with obstruction: Secondary | ICD-10-CM | POA: Diagnosis not present

## 2021-08-17 DIAGNOSIS — I440 Atrioventricular block, first degree: Secondary | ICD-10-CM | POA: Diagnosis not present

## 2021-08-17 DIAGNOSIS — I10 Essential (primary) hypertension: Secondary | ICD-10-CM | POA: Diagnosis not present

## 2021-08-17 DIAGNOSIS — R918 Other nonspecific abnormal finding of lung field: Secondary | ICD-10-CM | POA: Diagnosis not present

## 2021-08-17 DIAGNOSIS — R338 Other retention of urine: Secondary | ICD-10-CM | POA: Diagnosis not present

## 2021-08-17 DIAGNOSIS — R0602 Shortness of breath: Secondary | ICD-10-CM | POA: Diagnosis not present

## 2021-08-17 DIAGNOSIS — L89620 Pressure ulcer of left heel, unstageable: Secondary | ICD-10-CM | POA: Diagnosis not present

## 2021-08-17 DIAGNOSIS — K828 Other specified diseases of gallbladder: Secondary | ICD-10-CM | POA: Diagnosis not present

## 2021-08-17 DIAGNOSIS — L97429 Non-pressure chronic ulcer of left heel and midfoot with unspecified severity: Secondary | ICD-10-CM | POA: Diagnosis not present

## 2021-08-17 DIAGNOSIS — I5030 Unspecified diastolic (congestive) heart failure: Secondary | ICD-10-CM | POA: Diagnosis not present

## 2021-08-17 DIAGNOSIS — R1011 Right upper quadrant pain: Secondary | ICD-10-CM

## 2021-08-17 DIAGNOSIS — R32 Unspecified urinary incontinence: Secondary | ICD-10-CM | POA: Diagnosis not present

## 2021-08-17 DIAGNOSIS — K8050 Calculus of bile duct without cholangitis or cholecystitis without obstruction: Secondary | ICD-10-CM | POA: Diagnosis not present

## 2021-08-17 DIAGNOSIS — J9621 Acute and chronic respiratory failure with hypoxia: Secondary | ICD-10-CM | POA: Diagnosis not present

## 2021-08-17 DIAGNOSIS — J9611 Chronic respiratory failure with hypoxia: Secondary | ICD-10-CM | POA: Diagnosis not present

## 2021-08-17 DIAGNOSIS — R1084 Generalized abdominal pain: Secondary | ICD-10-CM | POA: Diagnosis not present

## 2021-08-17 DIAGNOSIS — K8042 Calculus of bile duct with acute cholecystitis without obstruction: Secondary | ICD-10-CM | POA: Diagnosis not present

## 2021-08-17 DIAGNOSIS — I5032 Chronic diastolic (congestive) heart failure: Secondary | ICD-10-CM | POA: Diagnosis not present

## 2021-08-17 DIAGNOSIS — I441 Atrioventricular block, second degree: Secondary | ICD-10-CM | POA: Diagnosis not present

## 2021-08-17 DIAGNOSIS — R5383 Other fatigue: Secondary | ICD-10-CM | POA: Diagnosis not present

## 2021-08-17 DIAGNOSIS — K567 Ileus, unspecified: Secondary | ICD-10-CM | POA: Diagnosis not present

## 2021-08-17 DIAGNOSIS — I4891 Unspecified atrial fibrillation: Secondary | ICD-10-CM | POA: Diagnosis not present

## 2021-08-17 DIAGNOSIS — R55 Syncope and collapse: Secondary | ICD-10-CM | POA: Diagnosis not present

## 2021-08-17 DIAGNOSIS — L89159 Pressure ulcer of sacral region, unspecified stage: Secondary | ICD-10-CM | POA: Diagnosis not present

## 2021-08-17 DIAGNOSIS — K760 Fatty (change of) liver, not elsewhere classified: Secondary | ICD-10-CM | POA: Diagnosis not present

## 2021-08-17 DIAGNOSIS — G4733 Obstructive sleep apnea (adult) (pediatric): Secondary | ICD-10-CM | POA: Diagnosis not present

## 2021-08-17 DIAGNOSIS — Z01810 Encounter for preprocedural cardiovascular examination: Secondary | ICD-10-CM | POA: Diagnosis not present

## 2021-08-17 DIAGNOSIS — J189 Pneumonia, unspecified organism: Secondary | ICD-10-CM | POA: Diagnosis not present

## 2021-08-17 DIAGNOSIS — L89150 Pressure ulcer of sacral region, unstageable: Secondary | ICD-10-CM | POA: Diagnosis not present

## 2021-08-17 DIAGNOSIS — R2232 Localized swelling, mass and lump, left upper limb: Secondary | ICD-10-CM | POA: Diagnosis not present

## 2021-08-17 DIAGNOSIS — R140 Abdominal distension (gaseous): Secondary | ICD-10-CM | POA: Diagnosis not present

## 2021-08-17 DIAGNOSIS — D721 Eosinophilia, unspecified: Secondary | ICD-10-CM | POA: Diagnosis not present

## 2021-08-17 DIAGNOSIS — I5023 Acute on chronic systolic (congestive) heart failure: Secondary | ICD-10-CM | POA: Diagnosis not present

## 2021-08-17 DIAGNOSIS — N401 Enlarged prostate with lower urinary tract symptoms: Secondary | ICD-10-CM | POA: Diagnosis not present

## 2021-08-17 DIAGNOSIS — L299 Pruritus, unspecified: Secondary | ICD-10-CM | POA: Diagnosis not present

## 2021-08-17 HISTORY — DX: Right upper quadrant pain: R10.11

## 2021-08-22 DIAGNOSIS — Z9049 Acquired absence of other specified parts of digestive tract: Secondary | ICD-10-CM | POA: Diagnosis not present

## 2021-08-22 DIAGNOSIS — K8050 Calculus of bile duct without cholangitis or cholecystitis without obstruction: Secondary | ICD-10-CM | POA: Diagnosis not present

## 2021-08-22 DIAGNOSIS — K838 Other specified diseases of biliary tract: Secondary | ICD-10-CM | POA: Diagnosis not present

## 2021-08-30 DIAGNOSIS — R197 Diarrhea, unspecified: Secondary | ICD-10-CM | POA: Diagnosis not present

## 2021-08-30 DIAGNOSIS — I951 Orthostatic hypotension: Secondary | ICD-10-CM | POA: Diagnosis not present

## 2021-08-30 DIAGNOSIS — R946 Abnormal results of thyroid function studies: Secondary | ICD-10-CM | POA: Diagnosis not present

## 2021-08-30 DIAGNOSIS — Z743 Need for continuous supervision: Secondary | ICD-10-CM | POA: Diagnosis not present

## 2021-08-30 DIAGNOSIS — D509 Iron deficiency anemia, unspecified: Secondary | ICD-10-CM | POA: Diagnosis not present

## 2021-08-30 DIAGNOSIS — L97429 Non-pressure chronic ulcer of left heel and midfoot with unspecified severity: Secondary | ICD-10-CM | POA: Diagnosis not present

## 2021-08-30 DIAGNOSIS — Z5181 Encounter for therapeutic drug level monitoring: Secondary | ICD-10-CM | POA: Diagnosis not present

## 2021-08-30 DIAGNOSIS — G4700 Insomnia, unspecified: Secondary | ICD-10-CM | POA: Diagnosis not present

## 2021-08-30 DIAGNOSIS — R339 Retention of urine, unspecified: Secondary | ICD-10-CM | POA: Diagnosis not present

## 2021-08-30 DIAGNOSIS — I441 Atrioventricular block, second degree: Secondary | ICD-10-CM | POA: Diagnosis not present

## 2021-08-30 DIAGNOSIS — G4733 Obstructive sleep apnea (adult) (pediatric): Secondary | ICD-10-CM | POA: Diagnosis not present

## 2021-08-30 DIAGNOSIS — M7989 Other specified soft tissue disorders: Secondary | ICD-10-CM | POA: Diagnosis not present

## 2021-08-30 DIAGNOSIS — L89620 Pressure ulcer of left heel, unstageable: Secondary | ICD-10-CM | POA: Diagnosis not present

## 2021-08-30 DIAGNOSIS — R918 Other nonspecific abnormal finding of lung field: Secondary | ICD-10-CM | POA: Diagnosis not present

## 2021-08-30 DIAGNOSIS — K8042 Calculus of bile duct with acute cholecystitis without obstruction: Secondary | ICD-10-CM | POA: Diagnosis not present

## 2021-08-30 DIAGNOSIS — D7219 Other eosinophilia: Secondary | ICD-10-CM | POA: Insufficient documentation

## 2021-08-30 DIAGNOSIS — E43 Unspecified severe protein-calorie malnutrition: Secondary | ICD-10-CM | POA: Diagnosis not present

## 2021-08-30 DIAGNOSIS — R159 Full incontinence of feces: Secondary | ICD-10-CM | POA: Diagnosis not present

## 2021-08-30 DIAGNOSIS — R140 Abdominal distension (gaseous): Secondary | ICD-10-CM | POA: Diagnosis not present

## 2021-08-30 DIAGNOSIS — I5032 Chronic diastolic (congestive) heart failure: Secondary | ICD-10-CM | POA: Diagnosis not present

## 2021-08-30 DIAGNOSIS — R1084 Generalized abdominal pain: Secondary | ICD-10-CM | POA: Diagnosis not present

## 2021-08-30 DIAGNOSIS — R55 Syncope and collapse: Secondary | ICD-10-CM | POA: Diagnosis not present

## 2021-08-30 DIAGNOSIS — D72829 Elevated white blood cell count, unspecified: Secondary | ICD-10-CM | POA: Diagnosis not present

## 2021-08-30 DIAGNOSIS — E119 Type 2 diabetes mellitus without complications: Secondary | ICD-10-CM | POA: Diagnosis not present

## 2021-08-30 DIAGNOSIS — I10 Essential (primary) hypertension: Secondary | ICD-10-CM | POA: Diagnosis not present

## 2021-08-30 DIAGNOSIS — L299 Pruritus, unspecified: Secondary | ICD-10-CM | POA: Diagnosis not present

## 2021-08-30 DIAGNOSIS — E785 Hyperlipidemia, unspecified: Secondary | ICD-10-CM | POA: Diagnosis not present

## 2021-08-30 DIAGNOSIS — K8050 Calculus of bile duct without cholangitis or cholecystitis without obstruction: Secondary | ICD-10-CM | POA: Diagnosis not present

## 2021-08-30 DIAGNOSIS — N401 Enlarged prostate with lower urinary tract symptoms: Secondary | ICD-10-CM | POA: Diagnosis not present

## 2021-08-30 DIAGNOSIS — T361X5A Adverse effect of cephalosporins and other beta-lactam antibiotics, initial encounter: Secondary | ICD-10-CM | POA: Diagnosis not present

## 2021-08-30 DIAGNOSIS — L98429 Non-pressure chronic ulcer of back with unspecified severity: Secondary | ICD-10-CM | POA: Diagnosis not present

## 2021-08-30 DIAGNOSIS — K5730 Diverticulosis of large intestine without perforation or abscess without bleeding: Secondary | ICD-10-CM | POA: Diagnosis not present

## 2021-08-30 DIAGNOSIS — K810 Acute cholecystitis: Secondary | ICD-10-CM | POA: Diagnosis not present

## 2021-08-30 DIAGNOSIS — I5023 Acute on chronic systolic (congestive) heart failure: Secondary | ICD-10-CM | POA: Diagnosis not present

## 2021-08-30 DIAGNOSIS — L89159 Pressure ulcer of sacral region, unspecified stage: Secondary | ICD-10-CM | POA: Diagnosis not present

## 2021-08-30 DIAGNOSIS — I480 Paroxysmal atrial fibrillation: Secondary | ICD-10-CM | POA: Diagnosis not present

## 2021-08-30 DIAGNOSIS — R5383 Other fatigue: Secondary | ICD-10-CM | POA: Diagnosis not present

## 2021-08-30 DIAGNOSIS — L89310 Pressure ulcer of right buttock, unstageable: Secondary | ICD-10-CM | POA: Diagnosis not present

## 2021-08-30 DIAGNOSIS — Z978 Presence of other specified devices: Secondary | ICD-10-CM | POA: Diagnosis not present

## 2021-08-30 HISTORY — DX: Other eosinophilia: D72.19

## 2021-08-31 DIAGNOSIS — R197 Diarrhea, unspecified: Secondary | ICD-10-CM | POA: Diagnosis not present

## 2021-08-31 DIAGNOSIS — K810 Acute cholecystitis: Secondary | ICD-10-CM | POA: Diagnosis not present

## 2021-08-31 DIAGNOSIS — I951 Orthostatic hypotension: Secondary | ICD-10-CM | POA: Diagnosis not present

## 2021-08-31 DIAGNOSIS — I480 Paroxysmal atrial fibrillation: Secondary | ICD-10-CM | POA: Diagnosis not present

## 2021-08-31 DIAGNOSIS — D72829 Elevated white blood cell count, unspecified: Secondary | ICD-10-CM | POA: Diagnosis not present

## 2021-08-31 DIAGNOSIS — L89620 Pressure ulcer of left heel, unstageable: Secondary | ICD-10-CM | POA: Diagnosis not present

## 2021-08-31 DIAGNOSIS — R159 Full incontinence of feces: Secondary | ICD-10-CM | POA: Diagnosis not present

## 2021-08-31 DIAGNOSIS — E119 Type 2 diabetes mellitus without complications: Secondary | ICD-10-CM | POA: Diagnosis not present

## 2021-08-31 DIAGNOSIS — K8050 Calculus of bile duct without cholangitis or cholecystitis without obstruction: Secondary | ICD-10-CM | POA: Diagnosis not present

## 2021-08-31 DIAGNOSIS — L89159 Pressure ulcer of sacral region, unspecified stage: Secondary | ICD-10-CM | POA: Diagnosis not present

## 2021-09-03 DIAGNOSIS — R946 Abnormal results of thyroid function studies: Secondary | ICD-10-CM | POA: Diagnosis not present

## 2021-09-03 DIAGNOSIS — Z978 Presence of other specified devices: Secondary | ICD-10-CM | POA: Diagnosis not present

## 2021-09-03 DIAGNOSIS — D72829 Elevated white blood cell count, unspecified: Secondary | ICD-10-CM | POA: Diagnosis not present

## 2021-09-03 DIAGNOSIS — R339 Retention of urine, unspecified: Secondary | ICD-10-CM | POA: Diagnosis not present

## 2021-09-03 DIAGNOSIS — I5023 Acute on chronic systolic (congestive) heart failure: Secondary | ICD-10-CM | POA: Diagnosis not present

## 2021-09-03 DIAGNOSIS — K810 Acute cholecystitis: Secondary | ICD-10-CM | POA: Diagnosis not present

## 2021-09-07 DIAGNOSIS — G4700 Insomnia, unspecified: Secondary | ICD-10-CM | POA: Diagnosis not present

## 2021-09-13 DIAGNOSIS — L89310 Pressure ulcer of right buttock, unstageable: Secondary | ICD-10-CM | POA: Diagnosis not present

## 2021-09-17 DIAGNOSIS — I5032 Chronic diastolic (congestive) heart failure: Secondary | ICD-10-CM | POA: Diagnosis not present

## 2021-09-17 DIAGNOSIS — L89620 Pressure ulcer of left heel, unstageable: Secondary | ICD-10-CM | POA: Diagnosis not present

## 2021-09-17 DIAGNOSIS — G4733 Obstructive sleep apnea (adult) (pediatric): Secondary | ICD-10-CM | POA: Diagnosis not present

## 2021-09-17 DIAGNOSIS — E785 Hyperlipidemia, unspecified: Secondary | ICD-10-CM | POA: Diagnosis not present

## 2021-09-17 DIAGNOSIS — I10 Essential (primary) hypertension: Secondary | ICD-10-CM | POA: Diagnosis not present

## 2021-09-17 DIAGNOSIS — L89310 Pressure ulcer of right buttock, unstageable: Secondary | ICD-10-CM | POA: Diagnosis not present

## 2021-09-17 DIAGNOSIS — E119 Type 2 diabetes mellitus without complications: Secondary | ICD-10-CM | POA: Diagnosis not present

## 2021-09-17 DIAGNOSIS — D509 Iron deficiency anemia, unspecified: Secondary | ICD-10-CM | POA: Diagnosis not present

## 2021-09-17 DIAGNOSIS — E43 Unspecified severe protein-calorie malnutrition: Secondary | ICD-10-CM | POA: Diagnosis not present

## 2021-09-19 DIAGNOSIS — R55 Syncope and collapse: Secondary | ICD-10-CM | POA: Diagnosis not present

## 2021-09-20 DIAGNOSIS — N401 Enlarged prostate with lower urinary tract symptoms: Secondary | ICD-10-CM | POA: Diagnosis not present

## 2021-09-20 DIAGNOSIS — R338 Other retention of urine: Secondary | ICD-10-CM | POA: Diagnosis not present

## 2021-09-20 DIAGNOSIS — I5032 Chronic diastolic (congestive) heart failure: Secondary | ICD-10-CM | POA: Diagnosis not present

## 2021-09-20 DIAGNOSIS — I951 Orthostatic hypotension: Secondary | ICD-10-CM | POA: Diagnosis not present

## 2021-09-20 DIAGNOSIS — E119 Type 2 diabetes mellitus without complications: Secondary | ICD-10-CM | POA: Diagnosis not present

## 2021-09-20 DIAGNOSIS — S51811D Laceration without foreign body of right forearm, subsequent encounter: Secondary | ICD-10-CM | POA: Diagnosis not present

## 2021-09-20 DIAGNOSIS — I110 Hypertensive heart disease with heart failure: Secondary | ICD-10-CM | POA: Diagnosis not present

## 2021-09-20 DIAGNOSIS — L89310 Pressure ulcer of right buttock, unstageable: Secondary | ICD-10-CM | POA: Diagnosis not present

## 2021-09-20 DIAGNOSIS — J449 Chronic obstructive pulmonary disease, unspecified: Secondary | ICD-10-CM | POA: Diagnosis not present

## 2021-09-28 DIAGNOSIS — L89110 Pressure ulcer of right upper back, unstageable: Secondary | ICD-10-CM | POA: Diagnosis not present

## 2021-09-28 DIAGNOSIS — R1084 Generalized abdominal pain: Secondary | ICD-10-CM | POA: Diagnosis not present

## 2021-09-28 DIAGNOSIS — R001 Bradycardia, unspecified: Secondary | ICD-10-CM | POA: Diagnosis not present

## 2021-09-28 DIAGNOSIS — N390 Urinary tract infection, site not specified: Secondary | ICD-10-CM | POA: Diagnosis not present

## 2021-09-28 DIAGNOSIS — K6289 Other specified diseases of anus and rectum: Secondary | ICD-10-CM | POA: Diagnosis not present

## 2021-09-28 DIAGNOSIS — B965 Pseudomonas (aeruginosa) (mallei) (pseudomallei) as the cause of diseases classified elsewhere: Secondary | ICD-10-CM | POA: Diagnosis not present

## 2021-09-28 DIAGNOSIS — L03317 Cellulitis of buttock: Secondary | ICD-10-CM | POA: Diagnosis not present

## 2021-09-28 DIAGNOSIS — Z7401 Bed confinement status: Secondary | ICD-10-CM | POA: Diagnosis not present

## 2021-09-28 DIAGNOSIS — R531 Weakness: Secondary | ICD-10-CM | POA: Diagnosis not present

## 2021-09-28 DIAGNOSIS — R0902 Hypoxemia: Secondary | ICD-10-CM | POA: Diagnosis not present

## 2021-09-28 DIAGNOSIS — T83511A Infection and inflammatory reaction due to indwelling urethral catheter, initial encounter: Secondary | ICD-10-CM | POA: Diagnosis not present

## 2021-09-28 DIAGNOSIS — L03116 Cellulitis of left lower limb: Secondary | ICD-10-CM | POA: Diagnosis not present

## 2021-09-28 DIAGNOSIS — L89153 Pressure ulcer of sacral region, stage 3: Secondary | ICD-10-CM | POA: Diagnosis not present

## 2021-09-28 DIAGNOSIS — B9620 Unspecified Escherichia coli [E. coli] as the cause of diseases classified elsewhere: Secondary | ICD-10-CM | POA: Diagnosis not present

## 2021-09-28 DIAGNOSIS — I959 Hypotension, unspecified: Secondary | ICD-10-CM | POA: Diagnosis not present

## 2021-09-28 DIAGNOSIS — L03115 Cellulitis of right lower limb: Secondary | ICD-10-CM | POA: Diagnosis not present

## 2021-09-28 DIAGNOSIS — K7689 Other specified diseases of liver: Secondary | ICD-10-CM | POA: Diagnosis not present

## 2021-09-28 DIAGNOSIS — R29898 Other symptoms and signs involving the musculoskeletal system: Secondary | ICD-10-CM | POA: Diagnosis not present

## 2021-09-28 DIAGNOSIS — L8993 Pressure ulcer of unspecified site, stage 3: Secondary | ICD-10-CM | POA: Diagnosis not present

## 2021-09-28 DIAGNOSIS — R5381 Other malaise: Secondary | ICD-10-CM | POA: Diagnosis not present

## 2021-09-28 DIAGNOSIS — I5033 Acute on chronic diastolic (congestive) heart failure: Secondary | ICD-10-CM | POA: Diagnosis not present

## 2021-09-28 DIAGNOSIS — R609 Edema, unspecified: Secondary | ICD-10-CM | POA: Diagnosis not present

## 2021-09-28 DIAGNOSIS — L0231 Cutaneous abscess of buttock: Secondary | ICD-10-CM | POA: Diagnosis not present

## 2021-09-28 DIAGNOSIS — I951 Orthostatic hypotension: Secondary | ICD-10-CM | POA: Diagnosis not present

## 2021-09-28 DIAGNOSIS — N281 Cyst of kidney, acquired: Secondary | ICD-10-CM | POA: Diagnosis not present

## 2021-09-28 DIAGNOSIS — K5730 Diverticulosis of large intestine without perforation or abscess without bleeding: Secondary | ICD-10-CM | POA: Diagnosis not present

## 2021-10-02 DIAGNOSIS — N390 Urinary tract infection, site not specified: Secondary | ICD-10-CM | POA: Diagnosis not present

## 2021-10-02 DIAGNOSIS — T83511A Infection and inflammatory reaction due to indwelling urethral catheter, initial encounter: Secondary | ICD-10-CM | POA: Diagnosis not present

## 2021-10-02 DIAGNOSIS — I951 Orthostatic hypotension: Secondary | ICD-10-CM | POA: Diagnosis not present

## 2021-10-03 DIAGNOSIS — T83511A Infection and inflammatory reaction due to indwelling urethral catheter, initial encounter: Secondary | ICD-10-CM | POA: Diagnosis not present

## 2021-10-03 DIAGNOSIS — I5032 Chronic diastolic (congestive) heart failure: Secondary | ICD-10-CM | POA: Diagnosis not present

## 2021-10-03 DIAGNOSIS — I509 Heart failure, unspecified: Secondary | ICD-10-CM | POA: Diagnosis not present

## 2021-10-03 DIAGNOSIS — R5381 Other malaise: Secondary | ICD-10-CM | POA: Diagnosis not present

## 2021-10-03 DIAGNOSIS — L03317 Cellulitis of buttock: Secondary | ICD-10-CM | POA: Diagnosis not present

## 2021-10-03 DIAGNOSIS — D72829 Elevated white blood cell count, unspecified: Secondary | ICD-10-CM | POA: Diagnosis not present

## 2021-10-03 DIAGNOSIS — D649 Anemia, unspecified: Secondary | ICD-10-CM | POA: Diagnosis not present

## 2021-10-03 DIAGNOSIS — N390 Urinary tract infection, site not specified: Secondary | ICD-10-CM | POA: Diagnosis not present

## 2021-10-03 DIAGNOSIS — L89150 Pressure ulcer of sacral region, unstageable: Secondary | ICD-10-CM | POA: Diagnosis not present

## 2021-10-03 DIAGNOSIS — I951 Orthostatic hypotension: Secondary | ICD-10-CM | POA: Diagnosis not present

## 2021-10-03 DIAGNOSIS — B9620 Unspecified Escherichia coli [E. coli] as the cause of diseases classified elsewhere: Secondary | ICD-10-CM | POA: Diagnosis not present

## 2021-10-03 DIAGNOSIS — Z7401 Bed confinement status: Secondary | ICD-10-CM | POA: Diagnosis not present

## 2021-10-21 DIAGNOSIS — L0231 Cutaneous abscess of buttock: Secondary | ICD-10-CM | POA: Diagnosis not present

## 2021-10-21 DIAGNOSIS — T83511A Infection and inflammatory reaction due to indwelling urethral catheter, initial encounter: Secondary | ICD-10-CM | POA: Diagnosis not present

## 2021-10-21 DIAGNOSIS — L89310 Pressure ulcer of right buttock, unstageable: Secondary | ICD-10-CM | POA: Diagnosis not present

## 2021-10-21 DIAGNOSIS — B9562 Methicillin resistant Staphylococcus aureus infection as the cause of diseases classified elsewhere: Secondary | ICD-10-CM | POA: Diagnosis not present

## 2021-10-21 DIAGNOSIS — L03317 Cellulitis of buttock: Secondary | ICD-10-CM | POA: Diagnosis not present

## 2021-10-21 DIAGNOSIS — I5033 Acute on chronic diastolic (congestive) heart failure: Secondary | ICD-10-CM | POA: Diagnosis not present

## 2021-10-21 DIAGNOSIS — I951 Orthostatic hypotension: Secondary | ICD-10-CM | POA: Diagnosis not present

## 2021-10-21 DIAGNOSIS — I110 Hypertensive heart disease with heart failure: Secondary | ICD-10-CM | POA: Diagnosis not present

## 2021-10-21 DIAGNOSIS — N3001 Acute cystitis with hematuria: Secondary | ICD-10-CM | POA: Diagnosis not present

## 2021-10-23 DIAGNOSIS — I480 Paroxysmal atrial fibrillation: Secondary | ICD-10-CM | POA: Diagnosis not present

## 2021-10-23 DIAGNOSIS — E118 Type 2 diabetes mellitus with unspecified complications: Secondary | ICD-10-CM | POA: Diagnosis not present

## 2021-10-23 DIAGNOSIS — R339 Retention of urine, unspecified: Secondary | ICD-10-CM | POA: Diagnosis not present

## 2021-10-23 DIAGNOSIS — I5032 Chronic diastolic (congestive) heart failure: Secondary | ICD-10-CM | POA: Diagnosis not present

## 2021-10-23 DIAGNOSIS — M7918 Myalgia, other site: Secondary | ICD-10-CM | POA: Diagnosis not present

## 2021-10-23 DIAGNOSIS — L89150 Pressure ulcer of sacral region, unstageable: Secondary | ICD-10-CM | POA: Diagnosis not present

## 2021-10-23 DIAGNOSIS — L03317 Cellulitis of buttock: Secondary | ICD-10-CM | POA: Diagnosis not present

## 2021-10-23 DIAGNOSIS — Z79899 Other long term (current) drug therapy: Secondary | ICD-10-CM | POA: Diagnosis not present

## 2021-10-23 DIAGNOSIS — I951 Orthostatic hypotension: Secondary | ICD-10-CM | POA: Diagnosis not present

## 2021-10-24 DIAGNOSIS — Z4659 Encounter for fitting and adjustment of other gastrointestinal appliance and device: Secondary | ICD-10-CM | POA: Diagnosis not present

## 2021-10-24 DIAGNOSIS — K8050 Calculus of bile duct without cholangitis or cholecystitis without obstruction: Secondary | ICD-10-CM | POA: Diagnosis not present

## 2021-10-24 DIAGNOSIS — Z9049 Acquired absence of other specified parts of digestive tract: Secondary | ICD-10-CM | POA: Diagnosis not present

## 2021-10-24 DIAGNOSIS — K819 Cholecystitis, unspecified: Secondary | ICD-10-CM | POA: Diagnosis not present

## 2021-10-24 DIAGNOSIS — Z8719 Personal history of other diseases of the digestive system: Secondary | ICD-10-CM | POA: Diagnosis not present

## 2021-10-24 DIAGNOSIS — K8010 Calculus of gallbladder with chronic cholecystitis without obstruction: Secondary | ICD-10-CM | POA: Diagnosis not present

## 2021-10-24 DIAGNOSIS — K838 Other specified diseases of biliary tract: Secondary | ICD-10-CM | POA: Diagnosis not present

## 2021-10-30 DIAGNOSIS — R339 Retention of urine, unspecified: Secondary | ICD-10-CM | POA: Diagnosis not present

## 2021-10-30 DIAGNOSIS — E118 Type 2 diabetes mellitus with unspecified complications: Secondary | ICD-10-CM | POA: Diagnosis not present

## 2021-10-30 DIAGNOSIS — I951 Orthostatic hypotension: Secondary | ICD-10-CM | POA: Diagnosis not present

## 2021-10-30 DIAGNOSIS — R262 Difficulty in walking, not elsewhere classified: Secondary | ICD-10-CM | POA: Diagnosis not present

## 2021-10-30 DIAGNOSIS — L03317 Cellulitis of buttock: Secondary | ICD-10-CM | POA: Diagnosis not present

## 2021-10-30 DIAGNOSIS — M7918 Myalgia, other site: Secondary | ICD-10-CM | POA: Diagnosis not present

## 2021-10-30 DIAGNOSIS — L89150 Pressure ulcer of sacral region, unstageable: Secondary | ICD-10-CM | POA: Diagnosis not present

## 2021-10-30 DIAGNOSIS — I5032 Chronic diastolic (congestive) heart failure: Secondary | ICD-10-CM | POA: Diagnosis not present

## 2021-11-13 DIAGNOSIS — N401 Enlarged prostate with lower urinary tract symptoms: Secondary | ICD-10-CM | POA: Diagnosis not present

## 2021-11-13 DIAGNOSIS — Z79899 Other long term (current) drug therapy: Secondary | ICD-10-CM | POA: Diagnosis not present

## 2021-11-13 DIAGNOSIS — R339 Retention of urine, unspecified: Secondary | ICD-10-CM | POA: Diagnosis not present

## 2021-11-21 DIAGNOSIS — I480 Paroxysmal atrial fibrillation: Secondary | ICD-10-CM | POA: Diagnosis not present

## 2021-11-21 DIAGNOSIS — D649 Anemia, unspecified: Secondary | ICD-10-CM | POA: Diagnosis not present

## 2021-11-21 DIAGNOSIS — I951 Orthostatic hypotension: Secondary | ICD-10-CM | POA: Diagnosis not present

## 2021-11-21 DIAGNOSIS — E43 Unspecified severe protein-calorie malnutrition: Secondary | ICD-10-CM | POA: Diagnosis not present

## 2021-11-21 DIAGNOSIS — I5033 Acute on chronic diastolic (congestive) heart failure: Secondary | ICD-10-CM | POA: Diagnosis not present

## 2021-11-21 DIAGNOSIS — J449 Chronic obstructive pulmonary disease, unspecified: Secondary | ICD-10-CM | POA: Diagnosis not present

## 2021-11-21 DIAGNOSIS — I110 Hypertensive heart disease with heart failure: Secondary | ICD-10-CM | POA: Diagnosis not present

## 2021-11-21 DIAGNOSIS — E119 Type 2 diabetes mellitus without complications: Secondary | ICD-10-CM | POA: Diagnosis not present

## 2021-11-21 DIAGNOSIS — L89313 Pressure ulcer of right buttock, stage 3: Secondary | ICD-10-CM | POA: Diagnosis not present

## 2021-11-28 DIAGNOSIS — L97812 Non-pressure chronic ulcer of other part of right lower leg with fat layer exposed: Secondary | ICD-10-CM | POA: Diagnosis not present

## 2021-11-28 DIAGNOSIS — L97212 Non-pressure chronic ulcer of right calf with fat layer exposed: Secondary | ICD-10-CM | POA: Diagnosis not present

## 2021-11-28 DIAGNOSIS — L89154 Pressure ulcer of sacral region, stage 4: Secondary | ICD-10-CM | POA: Diagnosis not present

## 2021-11-28 DIAGNOSIS — L89314 Pressure ulcer of right buttock, stage 4: Secondary | ICD-10-CM | POA: Diagnosis not present

## 2021-11-28 DIAGNOSIS — I878 Other specified disorders of veins: Secondary | ICD-10-CM | POA: Diagnosis not present

## 2021-12-17 DIAGNOSIS — E785 Hyperlipidemia, unspecified: Secondary | ICD-10-CM | POA: Diagnosis not present

## 2021-12-17 DIAGNOSIS — R262 Difficulty in walking, not elsewhere classified: Secondary | ICD-10-CM | POA: Diagnosis not present

## 2021-12-17 DIAGNOSIS — L89150 Pressure ulcer of sacral region, unstageable: Secondary | ICD-10-CM | POA: Diagnosis not present

## 2021-12-17 DIAGNOSIS — M7918 Myalgia, other site: Secondary | ICD-10-CM | POA: Diagnosis not present

## 2021-12-17 DIAGNOSIS — I10 Essential (primary) hypertension: Secondary | ICD-10-CM | POA: Diagnosis not present

## 2021-12-17 DIAGNOSIS — E118 Type 2 diabetes mellitus with unspecified complications: Secondary | ICD-10-CM | POA: Diagnosis not present

## 2021-12-17 DIAGNOSIS — Z23 Encounter for immunization: Secondary | ICD-10-CM | POA: Diagnosis not present

## 2021-12-17 DIAGNOSIS — I5032 Chronic diastolic (congestive) heart failure: Secondary | ICD-10-CM | POA: Diagnosis not present

## 2021-12-17 DIAGNOSIS — M5136 Other intervertebral disc degeneration, lumbar region: Secondary | ICD-10-CM | POA: Diagnosis not present

## 2021-12-19 DIAGNOSIS — L89154 Pressure ulcer of sacral region, stage 4: Secondary | ICD-10-CM | POA: Diagnosis not present

## 2021-12-19 DIAGNOSIS — I872 Venous insufficiency (chronic) (peripheral): Secondary | ICD-10-CM | POA: Diagnosis not present

## 2021-12-19 DIAGNOSIS — L97212 Non-pressure chronic ulcer of right calf with fat layer exposed: Secondary | ICD-10-CM | POA: Diagnosis not present

## 2021-12-20 DIAGNOSIS — I5033 Acute on chronic diastolic (congestive) heart failure: Secondary | ICD-10-CM | POA: Diagnosis not present

## 2021-12-20 DIAGNOSIS — E43 Unspecified severe protein-calorie malnutrition: Secondary | ICD-10-CM | POA: Diagnosis not present

## 2021-12-20 DIAGNOSIS — L89313 Pressure ulcer of right buttock, stage 3: Secondary | ICD-10-CM | POA: Diagnosis not present

## 2021-12-20 DIAGNOSIS — E119 Type 2 diabetes mellitus without complications: Secondary | ICD-10-CM | POA: Diagnosis not present

## 2021-12-20 DIAGNOSIS — I951 Orthostatic hypotension: Secondary | ICD-10-CM | POA: Diagnosis not present

## 2021-12-20 DIAGNOSIS — J449 Chronic obstructive pulmonary disease, unspecified: Secondary | ICD-10-CM | POA: Diagnosis not present

## 2021-12-20 DIAGNOSIS — I480 Paroxysmal atrial fibrillation: Secondary | ICD-10-CM | POA: Diagnosis not present

## 2021-12-20 DIAGNOSIS — D649 Anemia, unspecified: Secondary | ICD-10-CM | POA: Diagnosis not present

## 2021-12-20 DIAGNOSIS — I110 Hypertensive heart disease with heart failure: Secondary | ICD-10-CM | POA: Diagnosis not present

## 2021-12-26 DIAGNOSIS — Z125 Encounter for screening for malignant neoplasm of prostate: Secondary | ICD-10-CM | POA: Diagnosis not present

## 2021-12-26 DIAGNOSIS — N401 Enlarged prostate with lower urinary tract symptoms: Secondary | ICD-10-CM | POA: Diagnosis not present

## 2021-12-26 DIAGNOSIS — R339 Retention of urine, unspecified: Secondary | ICD-10-CM | POA: Diagnosis not present

## 2021-12-27 DIAGNOSIS — I5033 Acute on chronic diastolic (congestive) heart failure: Secondary | ICD-10-CM | POA: Diagnosis not present

## 2021-12-27 DIAGNOSIS — E119 Type 2 diabetes mellitus without complications: Secondary | ICD-10-CM | POA: Diagnosis not present

## 2021-12-27 DIAGNOSIS — I440 Atrioventricular block, first degree: Secondary | ICD-10-CM | POA: Diagnosis not present

## 2021-12-27 DIAGNOSIS — L89150 Pressure ulcer of sacral region, unstageable: Secondary | ICD-10-CM | POA: Diagnosis not present

## 2021-12-27 DIAGNOSIS — R0689 Other abnormalities of breathing: Secondary | ICD-10-CM | POA: Diagnosis not present

## 2021-12-27 DIAGNOSIS — I509 Heart failure, unspecified: Secondary | ICD-10-CM | POA: Diagnosis not present

## 2021-12-27 DIAGNOSIS — I110 Hypertensive heart disease with heart failure: Secondary | ICD-10-CM | POA: Diagnosis not present

## 2021-12-27 DIAGNOSIS — I10 Essential (primary) hypertension: Secondary | ICD-10-CM | POA: Diagnosis not present

## 2021-12-27 DIAGNOSIS — R9082 White matter disease, unspecified: Secondary | ICD-10-CM | POA: Diagnosis not present

## 2021-12-27 DIAGNOSIS — R9431 Abnormal electrocardiogram [ECG] [EKG]: Secondary | ICD-10-CM | POA: Diagnosis not present

## 2021-12-27 DIAGNOSIS — J449 Chronic obstructive pulmonary disease, unspecified: Secondary | ICD-10-CM | POA: Diagnosis not present

## 2021-12-27 DIAGNOSIS — R55 Syncope and collapse: Secondary | ICD-10-CM | POA: Diagnosis not present

## 2021-12-27 DIAGNOSIS — G319 Degenerative disease of nervous system, unspecified: Secondary | ICD-10-CM | POA: Diagnosis not present

## 2021-12-27 DIAGNOSIS — R42 Dizziness and giddiness: Secondary | ICD-10-CM | POA: Diagnosis not present

## 2021-12-27 DIAGNOSIS — I4891 Unspecified atrial fibrillation: Secondary | ICD-10-CM | POA: Diagnosis not present

## 2021-12-27 DIAGNOSIS — G9389 Other specified disorders of brain: Secondary | ICD-10-CM | POA: Diagnosis not present

## 2021-12-27 DIAGNOSIS — E860 Dehydration: Secondary | ICD-10-CM | POA: Diagnosis not present

## 2021-12-27 DIAGNOSIS — M47812 Spondylosis without myelopathy or radiculopathy, cervical region: Secondary | ICD-10-CM | POA: Diagnosis not present

## 2021-12-27 DIAGNOSIS — R58 Hemorrhage, not elsewhere classified: Secondary | ICD-10-CM | POA: Diagnosis not present

## 2021-12-27 DIAGNOSIS — I5032 Chronic diastolic (congestive) heart failure: Secondary | ICD-10-CM | POA: Diagnosis not present

## 2021-12-27 DIAGNOSIS — R001 Bradycardia, unspecified: Secondary | ICD-10-CM | POA: Diagnosis not present

## 2021-12-28 DIAGNOSIS — R55 Syncope and collapse: Secondary | ICD-10-CM

## 2021-12-28 DIAGNOSIS — E119 Type 2 diabetes mellitus without complications: Secondary | ICD-10-CM

## 2021-12-28 DIAGNOSIS — I10 Essential (primary) hypertension: Secondary | ICD-10-CM

## 2021-12-28 DIAGNOSIS — I5032 Chronic diastolic (congestive) heart failure: Secondary | ICD-10-CM

## 2021-12-28 DIAGNOSIS — R001 Bradycardia, unspecified: Secondary | ICD-10-CM

## 2021-12-31 ENCOUNTER — Ambulatory Visit: Payer: Medicare HMO | Attending: Cardiology

## 2021-12-31 ENCOUNTER — Other Ambulatory Visit: Payer: Self-pay

## 2021-12-31 DIAGNOSIS — I495 Sick sinus syndrome: Secondary | ICD-10-CM | POA: Diagnosis not present

## 2022-01-02 ENCOUNTER — Other Ambulatory Visit: Payer: Self-pay

## 2022-01-02 ENCOUNTER — Telehealth: Payer: Self-pay | Admitting: Cardiology

## 2022-01-02 DIAGNOSIS — I495 Sick sinus syndrome: Secondary | ICD-10-CM

## 2022-01-02 NOTE — Telephone Encounter (Signed)
Called [employer] and they stated that the patients monitor gateway was not transmitting and they had advised the patient to send the monitor back. Called the patient and they stated that they have very poor cellular signal where they live. I spoke to Dr. Agustin Cree regarding this issue and he recommended that we switch the patient from a live zio to a regular zio heart monitor. I called the patient and informed them of this change and scheduled a monitor visit with them to have the new heart monitor put on the patient.

## 2022-01-02 NOTE — Telephone Encounter (Signed)
Travis Crawford is calling from [place] stating the patient's monitor gateway was not transmitting. They have advised the patient to send the monitor back. They are also requesting callback to discuss if replacement is needed.

## 2022-01-07 ENCOUNTER — Ambulatory Visit: Payer: Medicare HMO | Attending: Cardiology

## 2022-01-07 DIAGNOSIS — I495 Sick sinus syndrome: Secondary | ICD-10-CM

## 2022-01-09 DIAGNOSIS — L97512 Non-pressure chronic ulcer of other part of right foot with fat layer exposed: Secondary | ICD-10-CM | POA: Diagnosis not present

## 2022-01-09 DIAGNOSIS — L89154 Pressure ulcer of sacral region, stage 4: Secondary | ICD-10-CM | POA: Diagnosis not present

## 2022-01-09 DIAGNOSIS — I872 Venous insufficiency (chronic) (peripheral): Secondary | ICD-10-CM | POA: Diagnosis not present

## 2022-01-11 ENCOUNTER — Telehealth: Payer: Self-pay | Admitting: Cardiology

## 2022-01-11 DIAGNOSIS — I495 Sick sinus syndrome: Secondary | ICD-10-CM | POA: Diagnosis not present

## 2022-01-11 NOTE — Telephone Encounter (Signed)
Sam with [employer] is calling to report additional abnormal findings.

## 2022-01-11 NOTE — Telephone Encounter (Signed)
Called [employer] back and Sam reported that the patient had a 3 second pause due to a high grade AV block on 01/01/22 at 12:50 pm.. I will forward to Dr. Agustin Cree for evaluation. Please advise.

## 2022-01-11 NOTE — Telephone Encounter (Signed)
RN unavailable for transfer, please return call ASAP.  Phone#: [phone] reference# [id number]

## 2022-01-14 ENCOUNTER — Other Ambulatory Visit: Payer: Self-pay

## 2022-01-14 DIAGNOSIS — I480 Paroxysmal atrial fibrillation: Secondary | ICD-10-CM | POA: Diagnosis not present

## 2022-01-14 DIAGNOSIS — L89150 Pressure ulcer of sacral region, unstageable: Secondary | ICD-10-CM | POA: Diagnosis not present

## 2022-01-14 DIAGNOSIS — I5032 Chronic diastolic (congestive) heart failure: Secondary | ICD-10-CM | POA: Diagnosis not present

## 2022-01-14 DIAGNOSIS — E118 Type 2 diabetes mellitus with unspecified complications: Secondary | ICD-10-CM | POA: Diagnosis not present

## 2022-01-14 DIAGNOSIS — R55 Syncope and collapse: Secondary | ICD-10-CM | POA: Diagnosis not present

## 2022-01-14 DIAGNOSIS — Z79899 Other long term (current) drug therapy: Secondary | ICD-10-CM | POA: Diagnosis not present

## 2022-01-14 DIAGNOSIS — D471 Chronic myeloproliferative disease: Secondary | ICD-10-CM | POA: Diagnosis not present

## 2022-01-14 DIAGNOSIS — I10 Essential (primary) hypertension: Secondary | ICD-10-CM | POA: Diagnosis not present

## 2022-01-14 NOTE — Telephone Encounter (Signed)
Wife returned RN's call. 

## 2022-01-14 NOTE — Telephone Encounter (Signed)
Spoke with spouseVaughan Crawford per [employer]. She stated that the pt was still wearing a Zio and has not taken Atenolol in a long time. He has an appt on 01-16-22. She stated that his pulse rates are in the 60's.

## 2022-01-14 NOTE — Telephone Encounter (Signed)
Called pt regarding Zio report. No answer at home # and unable to leave message on wifes cell- per DPR.

## 2022-01-16 ENCOUNTER — Inpatient Hospital Stay (HOSPITAL_COMMUNITY)
Admission: EM | Admit: 2022-01-16 | Discharge: 2022-01-18 | DRG: 243 | Disposition: A | Discharge status: Home Health | Payer: Medicare HMO | Attending: Cardiology | Admitting: Cardiology

## 2022-01-16 ENCOUNTER — Encounter: Payer: Self-pay | Admitting: Cardiology

## 2022-01-16 ENCOUNTER — Ambulatory Visit: Payer: Medicare HMO | Attending: Cardiology | Admitting: Cardiology

## 2022-01-16 VITALS — BP 130/54 | HR 54 | Ht 70.0 in | Wt 195.8 lb

## 2022-01-16 DIAGNOSIS — R001 Bradycardia, unspecified: Secondary | ICD-10-CM | POA: Diagnosis not present

## 2022-01-16 DIAGNOSIS — E119 Type 2 diabetes mellitus without complications: Secondary | ICD-10-CM | POA: Diagnosis not present

## 2022-01-16 DIAGNOSIS — R55 Syncope and collapse: Secondary | ICD-10-CM

## 2022-01-16 DIAGNOSIS — F419 Anxiety disorder, unspecified: Secondary | ICD-10-CM | POA: Diagnosis present

## 2022-01-16 DIAGNOSIS — K219 Gastro-esophageal reflux disease without esophagitis: Secondary | ICD-10-CM | POA: Diagnosis present

## 2022-01-16 DIAGNOSIS — I10 Essential (primary) hypertension: Secondary | ICD-10-CM

## 2022-01-16 DIAGNOSIS — J449 Chronic obstructive pulmonary disease, unspecified: Secondary | ICD-10-CM | POA: Diagnosis present

## 2022-01-16 DIAGNOSIS — I499 Cardiac arrhythmia, unspecified: Secondary | ICD-10-CM | POA: Diagnosis not present

## 2022-01-16 DIAGNOSIS — I700 Atherosclerosis of aorta: Secondary | ICD-10-CM | POA: Diagnosis not present

## 2022-01-16 DIAGNOSIS — Z950 Presence of cardiac pacemaker: Secondary | ICD-10-CM | POA: Diagnosis not present

## 2022-01-16 DIAGNOSIS — I5032 Chronic diastolic (congestive) heart failure: Secondary | ICD-10-CM | POA: Diagnosis not present

## 2022-01-16 DIAGNOSIS — R531 Weakness: Secondary | ICD-10-CM | POA: Diagnosis not present

## 2022-01-16 DIAGNOSIS — I110 Hypertensive heart disease with heart failure: Secondary | ICD-10-CM | POA: Diagnosis present

## 2022-01-16 DIAGNOSIS — I441 Atrioventricular block, second degree: Secondary | ICD-10-CM | POA: Diagnosis not present

## 2022-01-16 DIAGNOSIS — R17 Unspecified jaundice: Secondary | ICD-10-CM | POA: Diagnosis not present

## 2022-01-16 DIAGNOSIS — I5030 Unspecified diastolic (congestive) heart failure: Secondary | ICD-10-CM | POA: Insufficient documentation

## 2022-01-16 DIAGNOSIS — E785 Hyperlipidemia, unspecified: Secondary | ICD-10-CM | POA: Diagnosis present

## 2022-01-16 DIAGNOSIS — M47814 Spondylosis without myelopathy or radiculopathy, thoracic region: Secondary | ICD-10-CM | POA: Diagnosis not present

## 2022-01-16 DIAGNOSIS — Z79899 Other long term (current) drug therapy: Secondary | ICD-10-CM | POA: Diagnosis not present

## 2022-01-16 DIAGNOSIS — Z888 Allergy status to other drugs, medicaments and biological substances status: Secondary | ICD-10-CM

## 2022-01-16 DIAGNOSIS — R609 Edema, unspecified: Secondary | ICD-10-CM | POA: Diagnosis not present

## 2022-01-16 DIAGNOSIS — Z87442 Personal history of urinary calculi: Secondary | ICD-10-CM | POA: Diagnosis not present

## 2022-01-16 DIAGNOSIS — N400 Enlarged prostate without lower urinary tract symptoms: Secondary | ICD-10-CM | POA: Diagnosis present

## 2022-01-16 DIAGNOSIS — I517 Cardiomegaly: Secondary | ICD-10-CM | POA: Diagnosis not present

## 2022-01-16 DIAGNOSIS — I4891 Unspecified atrial fibrillation: Secondary | ICD-10-CM | POA: Diagnosis not present

## 2022-01-16 LAB — BASIC METABOLIC PANEL
Anion gap: 9 (ref 5–15)
BUN: 18 mg/dL (ref 8–23)
CO2: 27 mmol/L (ref 22–32)
Calcium: 9.9 mg/dL (ref 8.9–10.3)
Chloride: 104 mmol/L (ref 98–111)
Creatinine, Ser: 0.75 mg/dL (ref 0.61–1.24)
GFR, Estimated: >60 mL/min (ref >60)
Glucose, Bld: 100 mg/dL — ABNORMAL HIGH (ref 70–99)
Potassium: 3.9 mmol/L (ref 3.5–5.1)
Sodium: 140 mmol/L (ref 135–145)

## 2022-01-16 LAB — CBC WITH DIFFERENTIAL/PLATELET
Abs Immature Granulocytes: 0.05 10*3/uL (ref 0.00–0.07)
Basophils Absolute: 0.1 10*3/uL (ref 0.0–0.1)
Basophils Relative: 1 %
Eosinophils Absolute: 0.8 10*3/uL — ABNORMAL HIGH (ref 0.0–0.5)
Eosinophils Relative: 6 %
HCT: 33.8 % — ABNORMAL LOW (ref 39.0–52.0)
Hemoglobin: 10.5 g/dL — ABNORMAL LOW (ref 13.0–17.0)
Immature Granulocytes: 0 %
Lymphocytes Relative: 23 %
Lymphs Abs: 3 10*3/uL (ref 0.7–4.0)
MCH: 27.8 pg (ref 26.0–34.0)
MCHC: 31.1 g/dL (ref 30.0–36.0)
MCV: 89.4 fL (ref 80.0–100.0)
Monocytes Absolute: 1 10*3/uL (ref 0.1–1.0)
Monocytes Relative: 7 %
Neutro Abs: 8.3 10*3/uL — ABNORMAL HIGH (ref 1.7–7.7)
Neutrophils Relative %: 63 %
Platelets: 327 10*3/uL (ref 150–400)
RBC: 3.78 MIL/uL — ABNORMAL LOW (ref 4.22–5.81)
RDW: 16.1 % — ABNORMAL HIGH (ref 11.5–15.5)
WBC: 13.1 10*3/uL — ABNORMAL HIGH (ref 4.0–10.5)
nRBC: 0 % (ref 0.0–0.2)

## 2022-01-16 MED ORDER — PRAVASTATIN SODIUM 10 MG PO TABS
20.0000 mg | ORAL_TABLET | Freq: Every day | ORAL | Status: DC
  Administered 2022-01-16 – 2022-01-17 (×2): 20 mg via ORAL
  Filled 2022-01-16 (×2): qty 2

## 2022-01-16 MED ORDER — POTASSIUM CHLORIDE CRYS ER 20 MEQ PO TBCR
20.0000 meq | EXTENDED_RELEASE_TABLET | Freq: Every day | ORAL | Status: DC
  Administered 2022-01-16: 20 meq via ORAL
  Filled 2022-01-16: qty 1

## 2022-01-16 MED ORDER — ACETAMINOPHEN 325 MG PO TABS
650.0000 mg | ORAL_TABLET | Freq: Four times a day (QID) | ORAL | Status: DC | PRN
  Administered 2022-01-17: 650 mg via ORAL
  Filled 2022-01-16: qty 2

## 2022-01-16 MED ORDER — ALBUTEROL SULFATE HFA 108 (90 BASE) MCG/ACT IN AERS: 2.0000 | INHALATION_SPRAY | Freq: Four times a day (QID) | RESPIRATORY_TRACT | Status: DC | PRN

## 2022-01-16 MED ORDER — BETHANECHOL CHLORIDE 10 MG PO TABS
10.0000 mg | ORAL_TABLET | Freq: Two times a day (BID) | ORAL | Status: DC
  Administered 2022-01-16 – 2022-01-18 (×3): 10 mg via ORAL
  Filled 2022-01-16 (×8): qty 1

## 2022-01-16 MED ORDER — ALBUTEROL SULFATE (2.5 MG/3ML) 0.083% IN NEBU: 2.5000 mg | INHALATION_SOLUTION | Freq: Four times a day (QID) | RESPIRATORY_TRACT | Status: DC | PRN

## 2022-01-16 MED ORDER — TORSEMIDE 20 MG PO TABS
20.0000 mg | ORAL_TABLET | Freq: Every day | ORAL | Status: DC
  Administered 2022-01-16: 20 mg via ORAL
  Filled 2022-01-16: qty 1

## 2022-01-16 NOTE — Progress Notes (Incomplete)
Electrophysiology Rounding Note  Patient Name: Fabion Gatson Date of Encounter: 01/17/2022   Primary Cardiologist: None Electrophysiologist: New   Subjective   No acute events overnight  Inpatient Medications    Scheduled Meds:  bethanechol  10 mg Oral BID PC   potassium chloride SA  20 mEq Oral Daily   pravastatin  20 mg Oral q1800   torsemide  20 mg Oral Daily   Continuous Infusions:  PRN Meds: acetaminophen, albuterol   Vital Signs    Vitals:   01/17/22 0312 01/17/22 0530 01/17/22 0645 01/17/22 0655  BP:  (!) 147/60 (!) 140/52   Pulse:  (!) 59 60   Resp:  (!) 23 20   Temp: 97.9 F (36.6 C)   98 F (36.7 C)  TempSrc:    Oral  SpO2:  98% 97%     Intake/Output Summary (Last 24 hours) at 01/17/2022 2119 Last data filed at 01/16/2022 4174 Gross per 24 hour  Intake --  Output 1200 ml  Net -1200 ml   There were no vitals filed for this visit.  Physical Exam    GEN- The patient is well appearing, alert and oriented x 3 today.   HEENT- No gross abnormality.  Lungs- Clear to ausculation bilaterally, normal work of breathing Heart-  Regular  rate and rhythm occasional skipped beat, no murmurs, rubs or gallops GI- soft, NT, ND, + BS Extremities- no clubbing or cyanosis. No edema Neuro- No obvious focal abnormality.   Labs    CBC Recent Labs    01/16/22 1445  WBC 13.1*  NEUTROABS 8.3*  HGB 10.5*  HCT 33.8*  MCV 89.4  PLT 081   Basic Metabolic Panel Recent Labs    01/16/22 1445  NA 140  K 3.9  CL 104  CO2 27  GLUCOSE 100*  BUN 18  CREATININE 0.75  CALCIUM 9.9   Liver Function Tests No results for input(s): "AST", "ALT", "ALKPHOS", "BILITOT", "PROT", "ALBUMIN" in the last 72 hours. No results for input(s): "LIPASE", "AMYLASE" in the last 72 hours. Cardiac Enzymes No results for input(s): "CKTOTAL", "CKMB", "CKMBINDEX", "TROPONINI" in the last 72 hours.   Telemetry    SB/NSR 50-60s with occasional dropped beats and very  long 1st degree block (personally reviewed)  Radiology    No results found.  Patient Profile     Anel Kasch Borquez is a 71 y.o. male with a history of diastolic CHF, HLD, HTN, COPD, GERD, BPH, DM2, Anxiety, and ETOH use who is being seen today for the evaluation of syncope and heart block at the request of Dr. Agustin Cree.   Assessment & Plan    1.  Advanced AV block 2. Syncope No obvious reversible cause EF 55-60% 07/2021 Not on AV nodal agents.  Reviewed risks, benefits, and alternatives to PPM implantation, including but not limited to bleeding, infection, pneumothorax, pericardial effusion, lead dislodgement, heart attack, stroke, or death.  Pt verbalized understanding and agrees to proceed at next available time.   NPO after a clear breakfast.    For questions or updates, please contact [facility] Please consult [email or web address] for contact info under Cardiology/STEMI.  Signed, Shirley Friar, PA-C  01/17/2022, 7:14 AM   I have seen and examined this patient with Oda Kilts.  Agree with above, note added to reflect my findings.  Patient feeling well today.  Discomfort from laying in bed.  No further episodes of syncope.  GEN: Well nourished, well developed, in no acute distress  HEENT: normal  Neck: no JVD, carotid bruits, or masses Cardiac: RRR; no murmurs, rubs, or gallops,no edema  Respiratory:  clear to auscultation bilaterally, normal work of breathing GI: soft, nontender, nondistended, + BS MS: no deformity or atrophy  Skin: warm and dry Neuro:  Strength and sensation are intact Psych: euthymic mood, full affect   Second-degree AV block: No obvious reversible causes.  He has had multiple episodes of syncope.  Due to that, we Celina Shiley need pacemaker implant.  Risk and benefits of been discussed.  Risk include bleeding, tamponade, infection, pneumothorax, lead dislodgment.  He understands these risks and is agreed to the procedure.  Jenniah Bhavsar M. Anita Mcadory  MD 01/17/2022 7:51 AM

## 2022-01-16 NOTE — Progress Notes (Signed)
Cardiology Office Note:    Date:  01/16/2022   ID:  Travis Crawford, DOB May 28, 1950, MRN [id number]  PCP:  Lowella Dandy, NP  Cardiologist:  Jenne Campus, MD    Referring MD: Lowella Dandy, NP   Chief Complaint  Patient presents with   Loss of Consciousness   Dizziness   Low HR    All ongoing since 12/2021    History of Present Illness:    Travis Crawford is a 71 y.o. male with past medical history significant for diastolic congestive heart failure, essential hypertension, hyperlipidemia, COPD, GERD, BPH, diabetes, anxiety, alcohol abuse, recently he was admitted to the hospital because of episode of syncope.  He was also found to be bradycardic, however his EKG showed first-degree AV block with relative sinus bradycardia, on top of that he did have some nonconducted APCs which probably resulted with more significant bradycardia.  He comes today to my office for regular follow-up.  He is feeling miserable he got multiple dizzy spells with near syncopal episodes.  Initially we tried to put Zio patch AT on him, however, he did not have cell signal at home and we have to switch to regular monitor.  Monitor Zio patch AT has been sent to company and we did receive results of it which show high degree AV block majority of time during the night however also during the day he did have multiple episodes of high degree AV block.  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/02/7508   History of kidney stones    one time   Hypertension    Lumbar spondylosis 12/24/2017   Pneumonia    Right hip pain 12/24/2017   RUQ pain 08/17/2021   Sciatica of right side 12/24/2017   Sleep apnea    wears cpap   Spinal stenosis 04/17/2016    Past Surgical History:  Procedure Laterality Date   APPENDECTOMY     CYST REMOVAL  TRUNK     breast on left   HEMORRHOID SURGERY     HERNIA REPAIR     umbilical hernia   LUMBAR LAMINECTOMY/DECOMPRESSION MICRODISCECTOMY N/A 04/17/2016   Procedure: LUMBAR LAMINECTOMY/DECOMPRESSION MICRODISCECTOMY 2 LEVELS;  Surgeon: Melina Schools, MD;  Location: [place];  Service: Orthopedics;  Laterality: N/A;    Current Medications: Current Meds  Medication Sig   acetaminophen (TYLENOL) 325 MG tablet Take 325 mg by mouth every 6 (six) hours as needed for mild pain or moderate pain.   albuterol (VENTOLIN HFA) 108 (90 Base) MCG/ACT inhaler Inhale 2 puffs into the lungs every 6 (six) hours as needed for wheezing or shortness of breath.   bethanechol (URECHOLINE) 10 MG tablet Take 10 mg by mouth 2 (two) times daily after a meal.   lovastatin (MEVACOR) 20 MG tablet Take 20 mg by mouth at bedtime.   potassium chloride SA (K-DUR,KLOR-CON) 20 MEQ tablet Take 20 mEq by mouth daily.   torsemide (DEMADEX) 20 MG tablet Take 20 mg by mouth daily.   [DISCONTINUED] atenolol-chlorthalidone (TENORETIC) 50-25 MG tablet Take 1 tablet by mouth daily.   [DISCONTINUED] metFORMIN (GLUCOPHAGE) 500 MG tablet Take 500 mg by mouth 2 (two) times daily with a meal.   [DISCONTINUED] methocarbamol (ROBAXIN) 500 MG tablet Take 1 tablet (500 mg total) by mouth 3 (three) times daily  as needed for muscle spasms.   [DISCONTINUED] montelukast (SINGULAIR) 10 MG tablet Take 10 mg by mouth at bedtime.   [DISCONTINUED] ondansetron (ZOFRAN) 4 MG tablet Take 1 tablet (4 mg total) by mouth every 8 (eight) hours as needed for nausea or vomiting.   [DISCONTINUED] oxyCODONE-acetaminophen (PERCOCET) 10-325 MG tablet Take 1 tablet by mouth every 4 (four) hours as needed for pain.     Allergies:   Ceftriaxone and No known allergies   Social History   Socioeconomic History   Marital status: Married    Spouse name: Not on file   Number of children: Not on file   Years of education: Not on file   Highest education level: Not on file   Occupational History   Not on file  Tobacco Use   Smoking status: Former    Types: Cigarettes    Quit date: 04/10/1996    Years since quitting: 25.7   Smokeless tobacco: Former    Types: Chew  Substance and Sexual Activity   Alcohol use: Yes    Alcohol/week: 21.0 standard drinks of alcohol    Types: 21 Shots of liquor per week   Drug use: No   Sexual activity: Not on file  Other Topics Concern   Not on file  Social History Narrative   Not on file   Social Determinants of Health   Financial Resource Strain: Not on file  Food Insecurity: Not on file  Transportation Needs: Not on file  Physical Activity: Not on file  Stress: Not on file  Social Connections: Not on file     Family History: The patient's family history includes Cancer in his mother. ROS:   Please see the history of present illness.    All 14 point review of systems negative except as described per history of present illness  EKGs/Labs/Other Studies Reviewed:      Recent Labs: No results found for requested labs within last 365 days.  Recent Lipid Panel No results found for: "CHOL", "TRIG", "HDL", "CHOLHDL", "VLDL", "LDLCALC", "LDLDIRECT"  Physical Exam:    VS:  BP (!) 130/54 (BP Location: Left Arm, Patient Position: Sitting)   Pulse (!) 54   Ht '5\' 10"'$  (1.778 m)   Wt 195 lb 12.8 oz (88.8 kg)   SpO2 99%   BMI 28.09 kg/m     Wt Readings from Last 3 Encounters:  01/16/22 195 lb 12.8 oz (88.8 kg)  04/17/16 218 lb (98.9 kg)  04/10/16 218 lb 14.4 oz (99.3 kg)     GEN:  Well nourished, well developed in no acute distress HEENT: Normal NECK: No JVD; No carotid bruits LYMPHATICS: No lymphadenopathy CARDIAC: RRR, no murmurs, no rubs, no gallops RESPIRATORY:  Clear to auscultation without rales, wheezing or rhonchi  ABDOMEN: Soft, non-tender, non-distended MUSCULOSKELETAL:  No edema; No deformity  SKIN: Warm and dry LOWER EXTREMITIES: no swelling NEUROLOGIC:  Alert and oriented x 3 PSYCHIATRIC:   Normal affect   ASSESSMENT:    1. Syncope and collapse   2. Sinus bradycardia   3. Essential hypertension   4. Chronic diastolic congestive heart failure (HCC)    PLAN:    In order of problems listed above:  Syncope and collapse probably related to significant bradycardia now we have progression of conduction system disease initially only problem with beta-blocker which of course has been withdrawn months ago then he presented to hospital with episode of syncope however were not able to catch any significant block while in the hospital then  monitor show worsening of AV conduction to the point that high degree AV block is noted.  I think he deserves to be paced.  I did spoke to Dr. Curt Bears my EP colleague he agree with this assessment.  Patient will be transferred to [facility] for elective pacemaker implantation.  He still wears regular Zio patch will try to get results of it as well. Chronic diastolic congestive heart failure: Seems to be compensated. Diabetes mellitus.  Followed by internal medicine team. Essential pretension blood pressure control.   Medication Adjustments/Labs and Tests Ordered: Current medicines are reviewed at length with the patient today.  Concerns regarding medicines are outlined above.  No orders of the defined types were placed in this encounter.  Medication changes: No orders of the defined types were placed in this encounter.   Signed, Park Liter, MD, [facility] 01/16/2022 10:54 AM    [place]

## 2022-01-16 NOTE — H&P (Signed)
ELECTROPHYSIOLOGY CONSULT NOTE    Patient ID: Travis Crawford MRN: [id number], DOB/AGE: 1950-07-29 71 y.o.  Admit date: 01/16/2022 Date of Consult: 01/16/2022  Primary Physician: Lowella Dandy, NP Primary Cardiologist: Dr. Agustin Cree  Electrophysiologist: New   Reason for admission: Heart block and syncope.   Patient Profile: Travis Crawford is a 71 y.o. male with a history of diastolic CHF, HLD, HTN, COPD, GERD, BPH, DM2, Anxiety, and ETOH use who is being seen today for the evaluation of syncope and heart block at the request of Dr. Agustin Cree.  HPI:  Travis Crawford is a 71 y.o. male with medical history as above.   Seen in Dr. Olga Millers office today after wearing a Zio which showed numerous nocturnal AV block episodes, but as well as episodes of high grade AV block during the day, accompanied by dizziness and near syncope.   He was thus sent to [facility] for permanent pacemaker consideration.      He has presented to [facility] as instructed. Labs pending at time of my exam.  States he is feeling better currently. Reports over the weekend had intermittent dizzy spells where he would feel lightheadedness and his ears would feel "hot".  Had a severe episode on Saturday but no LOC. He did have a syncopal spell around thanksgiving with a similar prodrome. Not on any AV nodal agents. No s/s ischemia.    Past Medical History:  Diagnosis Date   Arthritis    Complication of anesthesia    woke up early during hemorrhoid surgery   Diabetes mellitus without complication ([place])    type 2   Eosinophilic leukocytosis 60/73/7106   History of kidney stones    one time   Hypertension    Lumbar spondylosis 12/24/2017   Pneumonia    Right hip pain 12/24/2017   RUQ pain 08/17/2021   Sciatica of right side 12/24/2017   Sleep apnea    wears cpap   Spinal stenosis 04/17/2016     Surgical History:  Past Surgical History:  Procedure Laterality Date   APPENDECTOMY     CYST  REMOVAL TRUNK     breast on left   HEMORRHOID SURGERY     HERNIA REPAIR     umbilical hernia   LUMBAR LAMINECTOMY/DECOMPRESSION MICRODISCECTOMY N/A 04/17/2016   Procedure: LUMBAR LAMINECTOMY/DECOMPRESSION MICRODISCECTOMY 2 LEVELS;  Surgeon: Melina Schools, MD;  Location: [place];  Service: Orthopedics;  Laterality: N/A;     (Not in a hospital admission)   Inpatient Medications:   Allergies:  Allergies  Allergen Reactions   Ceftriaxone Itching    Bilateral lower extremity   No Known Allergies     Social History   Socioeconomic History   Marital status: Married    Spouse name: Not on file   Number of children: Not on file   Years of education: Not on file   Highest education level: Not on file  Occupational History   Not on file  Tobacco Use   Smoking status: Former    Types: Cigarettes    Quit date: 04/10/1996    Years since quitting: 25.7   Smokeless tobacco: Former    Types: Chew  Substance and Sexual Activity   Alcohol use: Yes    Alcohol/week: 21.0 standard drinks of alcohol    Types: 21 Shots of liquor per week   Drug use: No   Sexual activity: Not on file  Other Topics Concern   Not on file  Social History Narrative   Not  on file   Social Determinants of Health   Financial Resource Strain: Not on file  Food Insecurity: Not on file  Transportation Needs: Not on file  Physical Activity: Not on file  Stress: Not on file  Social Connections: Not on file  Intimate Partner Violence: Not on file     Family History  Problem Relation Age of Onset   Cancer Mother      Review of Systems: All other systems reviewed and are otherwise negative except as noted above.  Physical Exam: Vitals:   01/16/22 1251 01/16/22 1252  BP: (!) 159/72   Pulse: (!) 58   Resp: 13   Temp:  97.9 F (36.6 C)  TempSrc:  Oral  SpO2: 100%     GEN- The patient is well appearing, alert and oriented x 3 today.   HEENT: normocephalic, atraumatic; sclera clear, conjunctiva pink;  hearing intact; oropharynx clear; neck supple Lungs- Clear to ausculation bilaterally, normal work of breathing.  No wheezes, rales, rhonchi Heart- Slow but regular rate and rhythm, no murmurs, rubs or gallops GI- soft, non-tender, non-distended, bowel sounds present Extremities- no clubbing, cyanosis, or edema; DP/PT/radial pulses 2+ bilaterally MS- no significant deformity or atrophy Skin- warm and dry, no rash or lesion Psych- euthymic mood, full affect Neuro- strength and sensation are intact  Labs:   Lab Results  Component Value Date   WBC 9.9 04/10/2016   HGB 14.8 04/10/2016   HCT 44.2 04/10/2016   MCV 98.2 04/10/2016   PLT 226 04/10/2016   No results for input(s): "NA", "K", "CL", "CO2", "BUN", "CREATININE", "CALCIUM", "PROT", "BILITOT", "ALKPHOS", "ALT", "AST", "GLUCOSE" in the last 168 hours.  Invalid input(s): "LABALBU"    Radiology/Studies: No results found.  EKG:on arrival shows sinus brady at 59 bpm with very long PR interval and second degree HB (personally reviewed)  TELEMETRY: Sinus brady/ NSR in 50s low 60s, intermittently dropped beat (personally reviewed)  Assessment/Plan: 1.  Advanced AV block 2. Syncope Without reversible cause EF 55-60% 07/2021 Not on AV nodal agents.  Explained risks, benefits, and alternatives to PPM implantation, including but not limited to bleeding, infection, pneumothorax, pericardial effusion, lead dislodgement, heart attack, stroke, or death.  Pt verbalized understanding and agrees to proceed at next available time, likely tomorrow would be soonest.    For questions or updates, please contact [place] Please consult [email or web address] for contact info under Cardiology/STEMI.  Jacalyn Lefevre, PA-C  01/16/2022 1:37 PM

## 2022-01-16 NOTE — H&P (View-Only) (Signed)
Electrophysiology Rounding Note  Patient Name: Travis Crawford Date of Encounter: 01/17/2022   Primary Cardiologist: None Electrophysiologist: New   Subjective   No acute events overnight  Inpatient Medications    Scheduled Meds:  bethanechol  10 mg Oral BID PC   potassium chloride SA  20 mEq Oral Daily   pravastatin  20 mg Oral q1800   torsemide  20 mg Oral Daily   Continuous Infusions:  PRN Meds: acetaminophen, albuterol   Vital Signs    Vitals:   01/17/22 0312 01/17/22 0530 01/17/22 0645 01/17/22 0655  BP:  (!) 147/60 (!) 140/52   Pulse:  (!) 59 60   Resp:  (!) 23 20   Temp: 97.9 F (36.6 C)   98 F (36.7 C)  TempSrc:    Oral  SpO2:  98% 97%     Intake/Output Summary (Last 24 hours) at 01/17/2022 5366 Last data filed at 01/16/2022 4403 Gross per 24 hour  Intake --  Output 1200 ml  Net -1200 ml   There were no vitals filed for this visit.  Physical Exam    GEN- The patient is well appearing, alert and oriented x 3 today.   HEENT- No gross abnormality.  Lungs- Clear to ausculation bilaterally, normal work of breathing Heart-  Regular  rate and rhythm occasional skipped beat, no murmurs, rubs or gallops GI- soft, NT, ND, + BS Extremities- no clubbing or cyanosis. No edema Neuro- No obvious focal abnormality.   Labs    CBC Recent Labs    01/16/22 1445  WBC 13.1*  NEUTROABS 8.3*  HGB 10.5*  HCT 33.8*  MCV 89.4  PLT 474   Basic Metabolic Panel Recent Labs    01/16/22 1445  NA 140  K 3.9  CL 104  CO2 27  GLUCOSE 100*  BUN 18  CREATININE 0.75  CALCIUM 9.9   Liver Function Tests No results for input(s): "AST", "ALT", "ALKPHOS", "BILITOT", "PROT", "ALBUMIN" in the last 72 hours. No results for input(s): "LIPASE", "AMYLASE" in the last 72 hours. Cardiac Enzymes No results for input(s): "CKTOTAL", "CKMB", "CKMBINDEX", "TROPONINI" in the last 72 hours.   Telemetry    SB/NSR 50-60s with occasional dropped beats and very  long 1st degree block (personally reviewed)  Radiology    No results found.  Patient Profile     Travis Crawford is a 71 y.o. male with a history of diastolic CHF, HLD, HTN, COPD, GERD, BPH, DM2, Anxiety, and ETOH use who is being seen today for the evaluation of syncope and heart block at the request of Dr. Agustin Cree.   Assessment & Plan    1.  Advanced AV block 2. Syncope No obvious reversible cause EF 55-60% 07/2021 Not on AV nodal agents.  Reviewed risks, benefits, and alternatives to PPM implantation, including but not limited to bleeding, infection, pneumothorax, pericardial effusion, lead dislodgement, heart attack, stroke, or death.  Pt verbalized understanding and agrees to proceed at next available time.   NPO after a clear breakfast.    For questions or updates, please contact [place] Please consult [email or web address] for contact info under Cardiology/STEMI.  Signed, Shirley Friar, PA-C  01/17/2022, 7:14 AM   I have seen and examined this patient with Oda Kilts.  Agree with above, note added to reflect my findings.  Patient feeling well today.  Discomfort from laying in bed.  No further episodes of syncope.  GEN: Well nourished, well developed, in no acute distress  HEENT: normal  Neck: no JVD, carotid bruits, or masses Cardiac: RRR; no murmurs, rubs, or gallops,no edema  Respiratory:  clear to auscultation bilaterally, normal work of breathing GI: soft, nontender, nondistended, + BS MS: no deformity or atrophy  Skin: warm and dry Neuro:  Strength and sensation are intact Psych: euthymic mood, full affect   Second-degree AV block: No obvious reversible causes.  He has had multiple episodes of syncope.  Due to that, we Itay Mella need pacemaker implant.  Risk and benefits of been discussed.  Risk include bleeding, tamponade, infection, pneumothorax, lead dislodgment.  He understands these risks and is agreed to the procedure.  Alecxis Baltzell M. Shajuan Musso  MD 01/17/2022 7:51 AM

## 2022-01-16 NOTE — ED Provider Notes (Signed)
[facility] EMERGENCY DEPARTMENT Provider Note   CSN: [id number] Arrival date & time: 01/16/22  1240     History  Chief Complaint  Patient presents with   packmaker implant     Travis Crawford is a 72 y.o. male. With past medical history of diabetes, hypertension, syncope who presents to the emergency department for pacemaker placement.  Patient states that since November he has had worsening symptomatic bradycardia and syncope. States that just after Thanksgiving he was doing dishes when he fainted. He was evaluated by cardiology and placed on a holter monitor. He states that since then, when he stands to do anything he becomes lightheaded, warm and feels as if he is about to collapse. He states this happens almost every time he ambulates. Most recently this morning he felt similar and noted his heart rate in the 30s-40s. He had follow-up today with Dr. Eulis Canner, [facility] who felt it appropriate to send patient to ED for admission for pacemaker placement. Patient endorses lightheadedness. He denies palpitations, shortness of breath or chest pain.   HPI     Home Medications Prior to Admission medications   Medication Sig Start Date End Date Taking? Authorizing Provider  acetaminophen (TYLENOL) 325 MG tablet Take 650 mg by mouth every 6 (six) hours as needed for mild pain or moderate pain.   Yes [provider]  albuterol (VENTOLIN HFA) 108 (90 Base) MCG/ACT inhaler Inhale 2 puffs into the lungs every 6 (six) hours as needed for wheezing or shortness of breath.   Yes [provider]  bethanechol (URECHOLINE) 10 MG tablet Take 10 mg by mouth 2 (two) times daily after a meal. 11/13/21  Yes [provider]  lovastatin (MEVACOR) 20 MG tablet Take 20 mg by mouth at bedtime.   Yes [provider]  Polyvinyl Alcohol-Povidone (CLEAR EYES NATURAL TEARS OP) Place 1 drop into both eyes daily as needed.   Yes [provider]   potassium chloride SA (K-DUR,KLOR-CON) 20 MEQ tablet Take 20 mEq by mouth daily.   Yes [provider]  torsemide (DEMADEX) 20 MG tablet Take 20 mg by mouth daily.   Yes [provider]      Allergies    Ceftriaxone and No known allergies    Review of Systems   Review of Systems  Neurological:  Positive for syncope and light-headedness.  All other systems reviewed and are negative.   Physical Exam Updated Vital Signs BP (!) 164/65   Pulse (!) 51   Temp 97.9 F (36.6 C) (Oral)   Resp 14   SpO2 100%  Physical Exam Vitals and nursing note reviewed.  Constitutional:      General: He is not in acute distress.    Appearance: Normal appearance. He is normal weight. He is ill-appearing. He is not toxic-appearing.  HENT:     Head: Normocephalic and atraumatic.     Nose: Nose normal.     Mouth/Throat:     Mouth: Mucous membranes are moist.     Pharynx: Oropharynx is clear.  Eyes:     General: No scleral icterus.    Extraocular Movements: Extraocular movements intact.     Pupils: Pupils are equal, round, and reactive to light.  Cardiovascular:     Rate and Rhythm: Regular rhythm. Bradycardia present.     Pulses: Normal pulses.     Heart sounds: No murmur heard. Pulmonary:     Effort: Pulmonary effort is normal. No respiratory distress.  Breath sounds: Normal breath sounds.  Abdominal:     General: Bowel sounds are normal.     Palpations: Abdomen is soft.  Musculoskeletal:        General: Normal range of motion.     Cervical back: Neck supple.     Right lower leg: Edema present.     Left lower leg: Edema present.     Comments: Compression socks on   Skin:    General: Skin is warm and dry.     Capillary Refill: Capillary refill takes less than 2 seconds.  Neurological:     General: No focal deficit present.     Mental Status: He is alert and oriented to person, place, and time. Mental status is at baseline.  Psychiatric:        Mood and Affect:  Mood normal.        Behavior: Behavior normal.        Thought Content: Thought content normal.        Judgment: Judgment normal.     ED Results / Procedures / Treatments   Labs (all labs ordered are listed, but only abnormal results are displayed) Labs Reviewed  CBC WITH DIFFERENTIAL/PLATELET - Abnormal; Notable for the following components:      Result Value   WBC 13.1 (*)    RBC 3.78 (*)    Hemoglobin 10.5 (*)    HCT 33.8 (*)    RDW 16.1 (*)    Neutro Abs 8.3 (*)    Eosinophils Absolute 0.8 (*)    All other components within normal limits  BASIC METABOLIC PANEL    EKG EKG Interpretation  Date/Time:  Wednesday January 16 2022 12:50:55 EST Ventricular Rate:  59 PR Interval:  360 QRS Duration: 105 QT Interval:  439 QTC Calculation: 435 R Axis:   18 Text Interpretation: Type II 2nd degree AV block Prolonged PR interval Low voltage, extremity leads Now in AV block compared to prior EKG Confirmed by Leanord Asal ([id number]) on 01/16/2022 12:56:54 PM  Radiology No results found.  Procedures Procedures    Medications Ordered in ED Medications - No data to display  ED Course/ Medical Decision Making/ A&P Clinical Course as of 01/16/22 1515  Wed Jan 16, 2022  1314 Consulted and spoke with cards master Trish, who states Dr. Lovena Le, cardiology will come evaluate patient and update on plan.  [LA]    Clinical Course User Index [LA] Mickie Hillier, PA-C                           Medical Decision Making Amount and/or Complexity of Data Reviewed Labs: ordered.  Risk Decision regarding hospitalization.  Initial Impression and Ddx 71 year old male who presents to the emergency department from cardiology office for pacemaker placement. He is symptomatic when standing. He is hemodynamically stable on arrival. His EKG here shows high grade AV block. Will obtain basic labs and consult cards master. Patient PMH that increases complexity of ED encounter:  syncope,  HTN  Interpretation of Diagnostics I independent reviewed and interpreted the labs as followed: cbc with wbc 13. Bmp pending  - I independently visualized the following imaging with scope of interpretation limited to determining acute life threatening conditions related to emergency care: not indicated   Patient Reassessment and Ultimate Disposition/Management On reassessment continues to be stable.  He has been evaluated by cardiology and I have spoken with Barrington Ellison, PA-C who saw the patient and is placing  admission orders under Dr. Curt Bears for cardiology admission and pacemaker placement. The patient is agreeable to the plan.   No other work up requested at this time.   Patient management required discussion with the following services or consulting groups:  Cardiology  Complexity of Problems Addressed Acute complicated illness or Injury  Additional Data Reviewed and Analyzed Further history obtained from: Past medical history and medications listed in the EMR, Recent PCP notes, Recent Consult notes, and Care Everywhere  Patient Encounter Risk Assessment Consideration of hospitalization and Major procedures  Final Clinical Impression(s) / ED Diagnoses Final diagnoses:  Cardiac arrhythmia, unspecified cardiac arrhythmia type    Rx / DC Orders ED Discharge Orders     None         Mickie Hillier, PA-C 01/16/22 1515    Leanord Asal K, DO 01/16/22 1531

## 2022-01-16 NOTE — ED Triage Notes (Signed)
Pt BIB [facility] from his cardiologist's office to get his pacemaker implanted. Pt has been wearing his heart monitor for over a week. Pt is in afib between 40s-60s. Was told to come here to get his pacemaker implanted . A&O X4. Asymptomatic.VSS

## 2022-01-17 ENCOUNTER — Encounter (HOSPITAL_COMMUNITY)
Admission: EM | Disposition: A | Discharge status: Home Health | Payer: Self-pay | Source: Home / Self Care | Attending: Cardiology

## 2022-01-17 DIAGNOSIS — I441 Atrioventricular block, second degree: Secondary | ICD-10-CM | POA: Diagnosis not present

## 2022-01-17 HISTORY — PX: PACEMAKER IMPLANT: EP1218

## 2022-01-17 SURGERY — PACEMAKER IMPLANT

## 2022-01-17 MED ORDER — VANCOMYCIN HCL IN DEXTROSE 1-5 GM/200ML-% IV SOLN
1000.0000 mg | Freq: Two times a day (BID) | INTRAVENOUS | Status: AC
  Administered 2022-01-18: 1000 mg via INTRAVENOUS
  Filled 2022-01-17: qty 200

## 2022-01-17 MED ORDER — CHLORHEXIDINE GLUCONATE 4 % EX LIQD: 60.0000 mL | Freq: Once | CUTANEOUS | Status: DC

## 2022-01-17 MED ORDER — MIDAZOLAM HCL 5 MG/5ML IJ SOLN
INTRAMUSCULAR | Status: DC | PRN
  Administered 2022-01-17 (×2): 1 mg via INTRAVENOUS

## 2022-01-17 MED ORDER — POTASSIUM CHLORIDE CRYS ER 20 MEQ PO TBCR
20.0000 meq | EXTENDED_RELEASE_TABLET | Freq: Every day | ORAL | Status: DC
  Administered 2022-01-18: 20 meq via ORAL
  Filled 2022-01-17: qty 1

## 2022-01-17 MED ORDER — LIDOCAINE HCL (PF) 1 % IJ SOLN
INTRAMUSCULAR | Status: AC
  Filled 2022-01-17: qty 60

## 2022-01-17 MED ORDER — ACETAMINOPHEN 325 MG PO TABS
325.0000 mg | ORAL_TABLET | ORAL | Status: DC | PRN
  Administered 2022-01-17 – 2022-01-18 (×3): 650 mg via ORAL
  Filled 2022-01-17 (×3): qty 2

## 2022-01-17 MED ORDER — MIDAZOLAM HCL 5 MG/5ML IJ SOLN
INTRAMUSCULAR | Status: AC
  Filled 2022-01-17: qty 5

## 2022-01-17 MED ORDER — FENTANYL CITRATE (PF) 100 MCG/2ML IJ SOLN
INTRAMUSCULAR | Status: DC | PRN
  Administered 2022-01-17 (×2): 25 ug via INTRAVENOUS

## 2022-01-17 MED ORDER — FENTANYL CITRATE (PF) 100 MCG/2ML IJ SOLN
INTRAMUSCULAR | Status: AC
  Filled 2022-01-17: qty 2

## 2022-01-17 MED ORDER — SODIUM CHLORIDE 0.9 % IV SOLN: INTRAVENOUS | Status: DC

## 2022-01-17 MED ORDER — HEPARIN (PORCINE) IN NACL 1000-0.9 UT/500ML-% IV SOLN
INTRAVENOUS | Status: DC | PRN
  Administered 2022-01-17: 500 mL

## 2022-01-17 MED ORDER — TORSEMIDE 20 MG PO TABS
20.0000 mg | ORAL_TABLET | Freq: Every day | ORAL | Status: DC
  Administered 2022-01-18: 20 mg via ORAL
  Filled 2022-01-17: qty 1

## 2022-01-17 MED ORDER — VANCOMYCIN HCL IN DEXTROSE 1-5 GM/200ML-% IV SOLN
1000.0000 mg | INTRAVENOUS | Status: AC
  Administered 2022-01-17: 1000 mg via INTRAVENOUS

## 2022-01-17 MED ORDER — VANCOMYCIN HCL IN DEXTROSE 1-5 GM/200ML-% IV SOLN
INTRAVENOUS | Status: AC
  Filled 2022-01-17: qty 200

## 2022-01-17 MED ORDER — LIDOCAINE HCL (PF) 1 % IJ SOLN
INTRAMUSCULAR | Status: DC | PRN
  Administered 2022-01-17: 60 mL

## 2022-01-17 MED ORDER — SODIUM CHLORIDE 0.9 % IV SOLN
INTRAVENOUS | Status: AC
  Filled 2022-01-17: qty 2

## 2022-01-17 MED ORDER — ONDANSETRON HCL 4 MG/2ML IJ SOLN: 4.0000 mg | Freq: Four times a day (QID) | INTRAMUSCULAR | Status: DC | PRN

## 2022-01-17 MED ORDER — SODIUM CHLORIDE 0.9 % IV SOLN
80.0000 mg | INTRAVENOUS | Status: AC
  Administered 2022-01-17: 80 mg
  Filled 2022-01-17: qty 2

## 2022-01-17 SURGICAL SUPPLY — 13 items
CABLE SURGICAL S-101-97-12 (CABLE) ×1 IMPLANT
CATH CPS LOCATOR 3D MED (CATHETERS) IMPLANT
HELIX LOCKING TOOL (MISCELLANEOUS) ×1
LEAD ULTIPACE 52 LPA1231/52 (Lead) IMPLANT
LEAD ULTIPACE 65 LPA1231/65 (Lead) IMPLANT
PACEMAKER ASSURITY DR-RF (Pacemaker) IMPLANT
PAD DEFIB RADIO PHYSIO CONN (PAD) ×1 IMPLANT
SHEATH 7FR PRELUDE SNAP 13 (SHEATH) IMPLANT
SHEATH 9FR PRELUDE SNAP 13 (SHEATH) IMPLANT
SLITTER AGILIS HISPRO (INSTRUMENTS) IMPLANT
TOOL HELIX LOCKING (MISCELLANEOUS) IMPLANT
TRAY PACEMAKER INSERTION (PACKS) ×1 IMPLANT
WIRE HI TORQ VERSACORE-J 145CM (WIRE) IMPLANT

## 2022-01-17 NOTE — Interval H&P Note (Signed)
History and Physical Interval Note:  01/17/2022 2:32 PM  Travis Crawford  has presented today for surgery, with the diagnosis of heart block.  The various methods of treatment have been discussed with the patient and family. After consideration of risks, benefits and other options for treatment, the patient has consented to  Procedure(s): PACEMAKER IMPLANT (N/A) as a surgical intervention.  The patient's history has been reviewed, patient examined, no change in status, stable for surgery.  I have reviewed the patient's chart and labs.  Questions were answered to the patient's satisfaction.     Ennifer Harston [employer]

## 2022-01-17 NOTE — ED Notes (Signed)
ED TO INPATIENT HANDOFF REPORT  ED Nurse Name and Phone #: [phone]  S Name/Age/Gender Travis Crawford 71 y.o. male Room/Bed: 043C/043C  Code Status   Code Status: Full Code  Home/SNF/Other Home Patient oriented to: self, place, time, and situation Is this baseline? Yes   Triage Complete: Triage complete  Chief Complaint Symptomatic advanced heart block [I44.1]  Triage Note Pt BIB [facility] from his cardiologist's office to get his pacemaker implanted. Pt has been wearing his heart monitor for over a week. Pt is in afib between 40s-60s. Was told to come here to get his pacemaker implanted . A&O X4. Asymptomatic.VSS    Allergies Allergies  Allergen Reactions   Ceftriaxone Itching    Bilateral lower extremity   No Known Allergies     Level of Care/Admitting Diagnosis ED Disposition     ED Disposition  Admit   Condition  --   Comment  Hospital Area: [facility] [[id number]]  Level of Care: Telemetry Cardiac [103]  May admit patient to [facility] or [facility] if equivalent level of care is available:: No  Covid Evaluation: Asymptomatic - no recent exposure (last 10 days) testing not required  Diagnosis: Symptomatic advanced heart block [654051]  Admitting Physician: Melida Quitter [[id number]]  Attending Physician: Constance Haw [[id number]]  Certification:: I certify this patient will need inpatient services for at least 2 midnights  Estimated Length of Stay: 2          B Medical/Surgery History Past Medical History:  Diagnosis Date   Arthritis    Complication of anesthesia    woke up early during hemorrhoid surgery   Diabetes mellitus without complication ([place])    type 2   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/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   History of kidney stones    one time   Hypertension    Lumbar spondylosis 12/24/2017   Pneumonia    Right hip pain 12/24/2017   RUQ pain 08/17/2021   Sciatica of right side 12/24/2017   Sleep apnea    wears cpap    Spinal stenosis 04/17/2016   Past Surgical History:  Procedure Laterality Date   APPENDECTOMY     CYST REMOVAL TRUNK     breast on left   HEMORRHOID SURGERY     HERNIA REPAIR     umbilical hernia   LUMBAR LAMINECTOMY/DECOMPRESSION MICRODISCECTOMY N/A 04/17/2016   Procedure: LUMBAR LAMINECTOMY/DECOMPRESSION MICRODISCECTOMY 2 LEVELS;  Surgeon: Melina Schools, MD;  Location: [facility];  Service: Orthopedics;  Laterality: N/A;     A IV Location/Drains/Wounds Patient Lines/Drains/Airways Status     Active Line/Drains/Airways     Name Placement date Placement time Site Days   Peripheral IV 01/16/22 20 G 1.16" Anterior;Distal;Right;Upper Arm 01/16/22  1433  Arm  1   Peripheral IV 01/17/22 20 G Anterior;Left Forearm 01/17/22  0733  Forearm  less than 1   Incision (Closed) 04/17/16 Back 04/17/16  1103  -- 2101            Intake/Output Last 24 hours  Intake/Output Summary (Last 24 hours) at 01/17/2022 0940 Last data filed at 01/16/2022 4680 Gross per 24 hour  Intake --  Output 1200 ml  Net -1200 ml    Labs/Imaging Results for orders placed or performed during the hospital encounter of 01/16/22 (from the past 48 hour(s))  Basic metabolic panel     Status: Abnormal   Collection Time: 01/16/22  2:45 PM  Result Value Ref Range   Sodium 140 135 - 145 mmol/L  Potassium 3.9 3.5 - 5.1 mmol/L   Chloride 104 98 - 111 mmol/L   CO2 27 22 - 32 mmol/L   Glucose, Bld 100 (H) 70 - 99 mg/dL    Comment: Glucose reference range applies only to samples taken after fasting for at least 8 hours.   BUN 18 8 - 23 mg/dL   Creatinine, Ser 0.75 0.61 - 1.24 mg/dL   Calcium 9.9 8.9 - 10.3 mg/dL   GFR, Estimated >60 >60 mL/min    Comment: (NOTE) Calculated using the CKD-EPI Creatinine Equation (2021)    Anion gap 9 5 - 15    Comment: Performed at [facility] [address], [place], [place] [address]  CBC with Differential     Status: Abnormal   Collection Time: 01/16/22  2:45 PM  Result  Value Ref Range   WBC 13.1 (H) 4.0 - 10.5 K/uL   RBC 3.78 (L) 4.22 - 5.81 MIL/uL   Hemoglobin 10.5 (L) 13.0 - 17.0 g/dL   HCT 33.8 (L) 39.0 - 52.0 %   MCV 89.4 80.0 - 100.0 fL   MCH 27.8 26.0 - 34.0 pg   MCHC 31.1 30.0 - 36.0 g/dL   RDW 16.1 (H) 11.5 - 15.5 %   Platelets 327 150 - 400 K/uL   nRBC 0.0 0.0 - 0.2 %   Neutrophils Relative % 63 %   Neutro Abs 8.3 (H) 1.7 - 7.7 K/uL   Lymphocytes Relative 23 %   Lymphs Abs 3.0 0.7 - 4.0 K/uL   Monocytes Relative 7 %   Monocytes Absolute 1.0 0.1 - 1.0 K/uL   Eosinophils Relative 6 %   Eosinophils Absolute 0.8 (H) 0.0 - 0.5 K/uL   Basophils Relative 1 %   Basophils Absolute 0.1 0.0 - 0.1 K/uL   Immature Granulocytes 0 %   Abs Immature Granulocytes 0.05 0.00 - 0.07 K/uL    Comment: Performed at [facility], [place] [address] [place], [place] [address]   No results found.  Pending Labs Unresulted Labs (From admission, onward)     Start     Ordered   01/17/22 6226  Surgical PCR screen  (Screening)  Once,   R        01/17/22 0821            Vitals/Pain Today's Vitals   01/17/22 0655 01/17/22 0726 01/17/22 0845 01/17/22 0915  BP:   (!) 132/51 (!) 123/55  Pulse:   (!) 50 (!) 58  Resp:   15 16  Temp: 98 F (36.7 C)     TempSrc: Oral     SpO2:   98% 98%  PainSc:  5       Isolation Precautions No active isolations  Medications Medications  acetaminophen (TYLENOL) tablet 650 mg (has no administration in time range)  bethanechol (URECHOLINE) tablet 10 mg (10 mg Oral Given 01/16/22 2227)  pravastatin (PRAVACHOL) tablet 20 mg (20 mg Oral Given 01/16/22 2226)  albuterol (PROVENTIL) (2.5 MG/3ML) 0.083% nebulizer solution 2.5 mg (has no administration in time range)  chlorhexidine (HIBICLENS) 4 % liquid 4 Application (has no administration in time range)  chlorhexidine (HIBICLENS) 4 % liquid 4 Application (has no administration in time range)  0.9 %  sodium chloride infusion (has no administration in time range)  0.9 %   sodium chloride infusion (has no administration in time range)  gentamicin (GARAMYCIN) 80 mg in sodium chloride 0.9 % 500 mL irrigation (has no administration in time range)  vancomycin (VANCOCIN) IVPB 1000 mg/200 mL premix (has no administration in time range)  torsemide (DEMADEX) tablet 20 mg (has no administration in time range)  potassium chloride SA (KLOR-CON M) CR tablet 20 mEq (has no administration in time range)    Mobility walks with person assist     Focused Assessments    R Recommendations: See Admitting Provider Note  Report given to:   Additional Notes:

## 2022-01-17 NOTE — ED Notes (Signed)
Called 6E requesting that purple man be assigned

## 2022-01-17 NOTE — ED Notes (Signed)
Pt had one episode of loose stool; pt cleaned, brief and linens changed

## 2022-01-17 NOTE — Progress Notes (Signed)
Pt received from the cath lab to [place], VS as per flow. Sequence vitals started. PPM site C/D/I w/ scant sanguinous drainage.Sling in place. All questions and concerns addressed. Call bell placed within reach, will continue to monitor and maintain safety.

## 2022-01-17 NOTE — ED Notes (Signed)
Cardiology at bedside.

## 2022-01-18 ENCOUNTER — Inpatient Hospital Stay (HOSPITAL_COMMUNITY): Payer: Medicare HMO

## 2022-01-18 ENCOUNTER — Encounter (HOSPITAL_COMMUNITY): Payer: Self-pay | Admitting: Cardiology

## 2022-01-18 DIAGNOSIS — I441 Atrioventricular block, second degree: Secondary | ICD-10-CM | POA: Diagnosis not present

## 2022-01-18 DIAGNOSIS — R609 Edema, unspecified: Secondary | ICD-10-CM

## 2022-01-18 MED ORDER — TRAMADOL HCL 50 MG PO TABS: 50.0000 mg | ORAL_TABLET | Freq: Three times a day (TID) | ORAL | 0 refills | Status: DC | PRN

## 2022-01-18 MED ORDER — TRAMADOL HCL 50 MG PO TABS
100.0000 mg | ORAL_TABLET | Freq: Once | ORAL | Status: AC
  Administered 2022-01-18: 100 mg via ORAL
  Filled 2022-01-18: qty 2

## 2022-01-18 NOTE — Care Management Important Message (Signed)
Important Message  Patient Details  Name: Rahn Lacuesta MRN: [id number] Date of Birth: 06-09-1950   Medicare Important Message Given:  Yes     Shelda Altes 01/18/2022, 9:29 AM

## 2022-01-18 NOTE — Discharge Summary (Addendum)
ELECTROPHYSIOLOGY PROCEDURE DISCHARGE SUMMARY    Patient ID: Travis Crawford,  MRN: [id number], DOB/AGE: [birth year] 71 y.o.  Admit date: 01/16/2022 Discharge date: 01/18/2022  Primary Care Physician: Lowella Dandy, NP  Primary Cardiologist: None  Electrophysiologist: Dr. Curt Bears   Primary Discharge Diagnosis:  Symptomatic bradycardia status post pacemaker implantation this admission  Secondary Discharge Diagnosis:  Syncope  Allergies  Allergen Reactions   Ceftriaxone Itching    Bilateral lower extremity   No Known Allergies      Procedures This Admission:  1.  Implantation of a Abbott Dual Chamber PPM on 01/17/2022 by Dr. Curt Bears. The patient received a Abbott Assurity [id number] with a Abbott Ultipace 1231-52 right atrial lead and a Abbott Ultipace 1231-65 right ventricular lead.  There were no immediate post procedure complications.   2.  CXR on 01/18/2022 demonstrated no pneumothorax status post device implantation.       Brief HPI: Travis Crawford is a 71 y.o. male was admitted for symptomatic bradycardia and electrophysiology team asked to see for consideration of PPM implantation.  Past medical history includes above.  The patient has had symptomatic bradycardia without reversible causes identified.  Risks, benefits, and alternatives to PPM implantation were reviewed with the patient who wished to proceed.   Hospital Course:  The patient was admitted and underwent implantation of a St. Jude dual chamber PPM with details as outlined above.  He was monitored on telemetry overnight which demonstrated appropriate pacing.  Left chest was without hematoma or ecchymosis.  The device was interrogated and found to be functioning normally.  CXR was obtained and demonstrated no pneumothorax status post device implantation.  Wound care, arm mobility, and restrictions were reviewed with the patient.  The patient was examined and considered stable for discharge to home.     Pt did complain of R arm edema, that appeared very mild. 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  Recommended he keep his arm elevated and call with any worsening.  Anticoagulation resumption This patient is not on anticoagulation     Physical Exam: Vitals:   01/17/22 2338 01/18/22 0423 01/18/22 0742 01/18/22 1152  BP: (!) 142/58 (!) 138/56 (!) 134/56 126/60  Pulse: 60 60 66 65  Resp:   18 18  Temp: 98 F (36.7 C) 97.9 F (36.6 C) 98.3 F (36.8 C) (!) 97.4 F (36.3 C)  TempSrc: Oral Oral Oral Oral  SpO2: 96% 96% 95% 96%    GEN- The patient is well appearing, alert and oriented x 3 today.   HEENT: normocephalic, atraumatic; sclera clear, conjunctiva pink; hearing intact; oropharynx clear; neck supple, no JVP Lymph- no cervical lymphadenopathy Lungs- Clear to ausculation bilaterally, normal work of breathing.  No wheezes, rales, rhonchi Heart- Regular rate and rhythm, no murmurs, rubs or gallops, PMI not laterally displaced GI- soft, non-tender, non-distended, bowel sounds present, no hepatosplenomegaly Extremities- no clubbing, cyanosis, or edema; DP/PT/radial pulses 2+ bilaterally MS- no significant deformity or atrophy Skin- warm and dry, no rash or lesion, left chest without hematoma/ecchymosis Psych- euthymic mood, full affect Neuro- strength and sensation are intact   Labs:   Lab Results  Component Value Date   WBC 13.1 (H) 01/16/2022   HGB 10.5 (L) 01/16/2022   HCT 33.8 (L) 01/16/2022   MCV 89.4 01/16/2022   PLT 327 01/16/2022    Recent Labs  Lab 01/16/22 1445  NA 140  K 3.9  CL 104  CO2  27  BUN 18  CREATININE 0.75  CALCIUM 9.9  GLUCOSE 100*    Discharge Medications:  Allergies as of 01/18/2022       Reactions   Ceftriaxone Itching   Bilateral lower extremity   No Known Allergies         Medication List     TAKE these medications    acetaminophen 325 MG tablet Commonly known as:  TYLENOL Take 650 mg by mouth every 6 (six) hours as needed for mild pain or moderate pain.   albuterol 108 (90 Base) MCG/ACT inhaler Commonly known as: VENTOLIN HFA Inhale 2 puffs into the lungs every 6 (six) hours as needed for wheezing or shortness of breath.   bethanechol 10 MG tablet Commonly known as: URECHOLINE Take 10 mg by mouth 2 (two) times daily after a meal.   CLEAR EYES NATURAL TEARS OP Place 1 drop into both eyes daily as needed.   lovastatin 20 MG tablet Commonly known as: MEVACOR Take 20 mg by mouth at bedtime.   potassium chloride SA 20 MEQ tablet Commonly known as: KLOR-CON M Take 20 mEq by mouth daily.   torsemide 20 MG tablet Commonly known as: DEMADEX Take 20 mg by mouth daily.        Disposition:    Follow-up Information     [facility] Follow up.   Why: on 1/3 at 0920 for post pacemaker wound check Contact information: [place] [phone]                Duration of Discharge Encounter: Greater than 30 minutes including physician time.  Signed, Travis Friar, PA-C  01/18/2022 1:51 PM   I have seen and examined this patient with Travis Crawford.  Agree with above, note added to reflect my findings.  On exam, RRR,  no  murmurs.  She is now status post pacemaker for second degree AV block.  Device functioning appropriately.  Chest x-ray and interrogation without issue.  Plan for discharge today with follow-up in device clinic.  Adine Heimann M. Alyx Mcguirk MD 01/18/2022 2:33 PM

## 2022-01-18 NOTE — TOC Transition Note (Signed)
Transition of Care [facility]) - CM/SW Discharge Note   Patient Details  Name: Travis Crawford MRN: [id number] Date of Birth: 01-Apr-1950  [facility] CM/SW Contact:  Bartholomew Crews, RN Phone Number: [phone] 01/18/2022, 2:01 PM   Clinical Narrative:     Spoke with patient on hospital room to discuss post acute transition. Patient currently active with [facility] for RN (wound care) and PT. Notified Calvin at [facility] of transition home today. HH orders requested for resumption of services. Patient has transportation home. No further TOC needs identified at this time.  Final next level of care: [place] Barriers to Discharge: No Barriers Identified   Patient Goals and CMS Choice Patient states their goals for this hospitalization and ongoing recovery are:: return home CMS Medicare.gov Compare Post Acute Care list provided to:: Patient Choice offered to / list presented to : Patient  Discharge Placement                       Discharge Plan and Services                DME Arranged: N/A DME Agency: NA       HH Arranged: PT, RN [place] Agency: [facility] Date [facility] Agency Contacted: 01/18/22 Time HH Agency Contacted: 46 Representative spoke with at [place]: [place] Determinants of Health ([place]) Interventions     Readmission Risk Interventions     No data to display

## 2022-01-18 NOTE — Progress Notes (Signed)
Upper extremity vein mapping has been completed.   Preliminary results in CV Proc.   Jennalee Greaves Rettie Laird 01/18/2022 11:21 AM

## 2022-01-18 NOTE — Progress Notes (Signed)
Orthopedic Tech Progress Note Patient Details:  Travis Crawford [birth year] [id number]  Patient ID: Okey Dupre, male   DOB: 1950-04-07, 71 y.o.   MRN: [id number] I spoke with patients Rn they said the patient has a sling on. Karolee Stamps 01/18/2022, 5:31 AM

## 2022-01-18 NOTE — Evaluation (Signed)
Physical Therapy Evaluation Patient Details Name: Travis Crawford MRN: [id number] DOB: November 03, 1950 Today's Date: 01/18/2022  History of Present Illness  Patient is a 71 year old male with advanced AV block, syncope. S/p pacemaker implantation for for symptomatic bradycardia.  Clinical Impression  Patient is agreeable to PT evaluation. He reports using a rolling walker for mobility at baseline. He was able to tell me activity restrictions for LUE following pacemaker placement and was able to maintain with functional mobility. He is Mod I for bed mobility, transfers, and ambulation. No dizziness reported with activity. Blood pressure 133/65 in sitting and 126/60 in standing with good safety awareness demonstrated with need to progress activity slowly and monitor for any syncopal type symptoms for fall prevention. The patient was receiving home health PT and would like to resume this for chronic mobility issues. No further acute PT needs at this time.      Recommendations for follow up therapy are one component of a multi-disciplinary discharge planning process, led by the attending physician.  Recommendations may be updated based on patient status, additional functional criteria and insurance authorization.  Follow Up Recommendations Home health PT      Assistance Recommended at Discharge Set up Supervision/Assistance  Patient can return home with the following  A little help with bathing/dressing/bathroom;Assist for transportation    Equipment Recommendations None recommended by PT  Recommendations for Other Services       Functional Status Assessment Patient has had a recent decline in their functional status and demonstrates the ability to make significant improvements in function in a reasonable and predictable amount of time.     Precautions / Restrictions Precautions Precautions: ICD/Pacemaker      Mobility  Bed Mobility Overal bed mobility: Modified Independent                   Transfers Overall transfer level: Modified independent Equipment used: Rolling walker (2 wheels)               General transfer comment: bed height elevated per patient request. reinforced pacemaker precuations and patient was able to maintain with all functional mobility    Ambulation/Gait Ambulation/Gait assistance: Modified independent (Device/Increase time), Supervision Gait Distance (Feet): 115 Feet Assistive device: Rolling walker (2 wheels) (very light use of LUE on rolling walker) Gait Pattern/deviations: Step-through pattern Gait velocity: decreased     General Gait Details: slow but steady candence using rolling walker for support with ambulation. no dizziness is reported with activity. encouraged patient to perform short distance ambulation for conditioning at home  Stairs            Wheelchair Mobility    Modified Rankin (Stroke Patients Only)       Balance Overall balance assessment: Needs assistance Sitting-balance support: Feet supported Sitting balance-Leahy Scale: Good     Standing balance support: Bilateral upper extremity supported, During functional activity, Reliant on assistive device for balance Standing balance-Leahy Scale: Fair                               Pertinent Vitals/Pain Pain Assessment Pain Assessment: No/denies pain    Home Living Family/patient expects to be discharged to:: Private residence Living Arrangements: Spouse/significant other Available Help at Discharge: Family Type of Home: Mobile home Home Access: Ramped entrance       Home Layout: One level Home Equipment: Conservation officer, nature (2 wheels) Additional Comments: patient gets HHPT \  Prior Function Prior Level of Function : Independent/Modified Independent             Mobility Comments: using rolling walker for ambulation ADLs Comments: spouse assists as needed     Hand Dominance   Dominant Hand: Right     Extremity/Trunk Assessment   Upper Extremity Assessment Upper Extremity Assessment:  (LUE ROM not formally assessed due to pacemaker precuations)    Lower Extremity Assessment Lower Extremity Assessment: Overall WFL for tasks assessed       Communication   Communication: No difficulties  Cognition Arousal/Alertness: Awake/alert Behavior During Therapy: WFL for tasks assessed/performed Overall Cognitive Status: Within Functional Limits for tasks assessed                                          General Comments General comments (skin integrity, edema, etc.): seated blood pressure 133/65, standing blood pressure 126/60. heart rate in the 60-70 range during session.    Exercises     Assessment/Plan    PT Assessment All further PT needs can be met in the next venue of care  PT Problem List Decreased activity tolerance       PT Treatment Interventions      PT Goals (Current goals can be found in the Care Plan section)  Acute Rehab PT Goals Patient Stated Goal: to go home today PT Goal Formulation: All assessment and education complete, DC therapy    Frequency       Co-evaluation               AM-PAC PT "6 Clicks" Mobility  Outcome Measure Help needed turning from your back to your side while in a flat bed without using bedrails?: None Help needed moving from lying on your back to sitting on the side of a flat bed without using bedrails?: None Help needed moving to and from a bed to a chair (including a wheelchair)?: None Help needed standing up from a chair using your arms (e.g., wheelchair or bedside chair)?: None Help needed to walk in hospital room?: None Help needed climbing 3-5 steps with a railing? : A Little 6 Click Score: 23    End of Session   Activity Tolerance: Patient tolerated treatment well Patient left: in bed;with call bell/phone within reach Nurse Communication: Mobility status PT Visit Diagnosis: Muscle weakness  (generalized) (M62.81)    Time: 2297-9892 PT Time Calculation (min) (ACUTE ONLY): 34 min   Charges:   PT Evaluation $PT Eval Low Complexity: 1 Low PT Treatments $Therapeutic Activity: 8-22 mins        Travis Crawford, PT, MPT   Percell Locus 01/18/2022, 12:29 PM

## 2022-01-18 NOTE — Discharge Instructions (Signed)
After Your Pacemaker    ACTIVITY Do not lift your arm above shoulder height for 1 week after your procedure. After 7 days, you may progress as below.  You should remove your sling 24 hours after your procedure, unless otherwise instructed by your provider.     Friday January 25, 2022  Saturday January 26, 2022 Sunday January 27, 2022 Monday January 28, 2022   Do not lift, push, pull, or carry anything over 10 pounds with the affected arm until 6 weeks (Friday March 01, 2022 ) after your procedure.   You may drive AFTER your wound check, unless you have been told otherwise by your provider.   Ask your healthcare provider when you can go back to work   INCISION/Dressing If you are on a blood thinner such as Coumadin, Xarelto, Eliquis, Plavix, or Pradaxa please confirm with your provider when this should be resumed.   If large square, outer bandage is left in place, this can be removed after 24 hours from your procedure. Do not remove steri-strips or glue as below.   Monitor your Pacemaker site for redness, swelling, and drainage. Call the device clinic at [phone] if you experience these symptoms or fever/chills.  If your incision is sealed with Steri-strips or staples, you may shower 7 days after your procedure or when told by your provider. Do not remove the steri-strips or let the shower hit directly on your site. You may wash around your site with soap and water.    If you were discharged in a sling, please do not wear this during the day more than 48 hours after your surgery unless otherwise instructed. This may increase the risk of stiffness and soreness in your shoulder.   Avoid lotions, ointments, or perfumes over your incision until it is well-healed.  You may use a hot tub or a pool AFTER your wound check appointment if the incision is completely closed.  Pacemaker Alerts:  Some alerts are vibratory and others beep. These are NOT emergencies. Please call our office  to let us know. If this occurs at night or on weekends, it can wait until the next business day. Send a remote transmission.  If your device is capable of reading fluid status (for heart failure), you will be offered monthly monitoring to review this with you.   DEVICE MANAGEMENT Remote monitoring is used to monitor your pacemaker from home. This monitoring is scheduled every 91 days by our office. It allows [place] to keep an eye on the functioning of your device to ensure it is working properly. You will routinely see your Electrophysiologist annually (more often if necessary).   You should receive your ID card for your new device in 4-8 weeks. Keep this card with you at all times once received. Consider wearing a medical alert bracelet or necklace.  Your Pacemaker may be MRI compatible. This will be discussed at your next office visit/wound check.  You should avoid contact with strong electric or magnetic fields.   Do not use amateur (ham) radio equipment or electric (arc) welding torches. MP3 player headphones with magnets should not be used. Some devices are safe to use if held at least 12 inches (30 cm) from your Pacemaker. These include power tools, lawn mowers, and speakers. If you are unsure if something is safe to use, ask your health care provider.  When using your cell phone, hold it to the ear that is on the opposite side from the Pacemaker. Do not leave your  cell phone in a pocket over the Pacemaker.  You may safely use electric blankets, heating pads, computers, and microwave ovens.  Call the office right away if: You have chest pain. You feel more short of breath than you have felt before. You feel more light-headed than you have felt before. Your incision starts to open up.  This information is not intended to replace advice given to you by your health care provider. Make sure you discuss any questions you have with your health care provider.

## 2022-01-20 DIAGNOSIS — I441 Atrioventricular block, second degree: Secondary | ICD-10-CM | POA: Diagnosis not present

## 2022-01-20 DIAGNOSIS — I110 Hypertensive heart disease with heart failure: Secondary | ICD-10-CM | POA: Diagnosis not present

## 2022-01-20 DIAGNOSIS — D649 Anemia, unspecified: Secondary | ICD-10-CM | POA: Diagnosis not present

## 2022-01-20 DIAGNOSIS — I951 Orthostatic hypotension: Secondary | ICD-10-CM | POA: Diagnosis not present

## 2022-01-20 DIAGNOSIS — J449 Chronic obstructive pulmonary disease, unspecified: Secondary | ICD-10-CM | POA: Diagnosis not present

## 2022-01-20 DIAGNOSIS — E119 Type 2 diabetes mellitus without complications: Secondary | ICD-10-CM | POA: Diagnosis not present

## 2022-01-20 DIAGNOSIS — L89310 Pressure ulcer of right buttock, unstageable: Secondary | ICD-10-CM | POA: Diagnosis not present

## 2022-01-20 DIAGNOSIS — I480 Paroxysmal atrial fibrillation: Secondary | ICD-10-CM | POA: Diagnosis not present

## 2022-01-20 DIAGNOSIS — I5033 Acute on chronic diastolic (congestive) heart failure: Secondary | ICD-10-CM | POA: Diagnosis not present

## 2022-01-23 ENCOUNTER — Telehealth: Payer: Self-pay | Admitting: Cardiology

## 2022-01-23 DIAGNOSIS — I872 Venous insufficiency (chronic) (peripheral): Secondary | ICD-10-CM | POA: Diagnosis not present

## 2022-01-23 DIAGNOSIS — L97512 Non-pressure chronic ulcer of other part of right foot with fat layer exposed: Secondary | ICD-10-CM | POA: Diagnosis not present

## 2022-01-23 DIAGNOSIS — L89154 Pressure ulcer of sacral region, stage 4: Secondary | ICD-10-CM | POA: Diagnosis not present

## 2022-01-23 DIAGNOSIS — L97212 Non-pressure chronic ulcer of right calf with fat layer exposed: Secondary | ICD-10-CM | POA: Diagnosis not present

## 2022-01-23 NOTE — Telephone Encounter (Signed)
Wife calling to see if the patient can get MRI done with pacemaker

## 2022-01-25 NOTE — Telephone Encounter (Signed)
Returned call to Pt's wife.  Advised Pt's device and leads are MRI compatible.    Advised have not received request for MRI clearance form yet.  She will call [facility] and give them device clinic phone and fax number.  Await further needs.

## 2022-01-30 ENCOUNTER — Telehealth: Payer: Self-pay | Admitting: Cardiology

## 2022-01-30 DIAGNOSIS — I495 Sick sinus syndrome: Secondary | ICD-10-CM | POA: Diagnosis not present

## 2022-01-30 NOTE — Telephone Encounter (Signed)
[facility] called to report critical report. Pt wore the monitor 12/4-12/16-23. Pt had 962 pauses longest lasting 7.4 secs. 20 episodes if high-grade AV block lasting 2.26 sec. Concern for complete heart block. Report printed and given to Dr. Geraldo Pitter who is DOD.  Pt was admitted 01/16/22 to [facility] and had a pacemaker implanted 01/17/22.

## 2022-01-30 NOTE — Telephone Encounter (Signed)
[facility] by Theodore Demark is calling to report abnormal results.

## 2022-02-06 ENCOUNTER — Ambulatory Visit: Payer: Medicare HMO | Attending: Internal Medicine

## 2022-02-06 DIAGNOSIS — I441 Atrioventricular block, second degree: Secondary | ICD-10-CM | POA: Diagnosis not present

## 2022-02-06 LAB — CUP PACEART INCLINIC DEVICE CHECK
Battery Remaining Longevity: 76 mo
Battery Voltage: 3.04 V
Brady Statistic RA Percent Paced: 89 %
Brady Statistic RV Percent Paced: 99.92 %
Date Time Interrogation Session: 20240103121723
Implantable Lead Connection Status: 753985
Implantable Lead Connection Status: 753985
Implantable Lead Implant Date: 20231214
Implantable Lead Implant Date: 20231214
Implantable Lead Location: 753859
Implantable Lead Location: 753860
Implantable Pulse Generator Implant Date: 20231214
Lead Channel Impedance Value: 437.5 Ohm
Lead Channel Impedance Value: 512.5 Ohm
Lead Channel Pacing Threshold Amplitude: 0.75 V
Lead Channel Pacing Threshold Amplitude: 0.75 V
Lead Channel Pacing Threshold Amplitude: 0.75 V
Lead Channel Pacing Threshold Amplitude: 0.75 V
Lead Channel Pacing Threshold Pulse Width: 0.5 ms
Lead Channel Pacing Threshold Pulse Width: 0.5 ms
Lead Channel Pacing Threshold Pulse Width: 0.5 ms
Lead Channel Pacing Threshold Pulse Width: 0.5 ms
Lead Channel Sensing Intrinsic Amplitude: 1.2 mV
Lead Channel Sensing Intrinsic Amplitude: 3.5 mV
Lead Channel Setting Pacing Amplitude: 1 V
Lead Channel Setting Pacing Amplitude: 3.5 V
Lead Channel Setting Pacing Pulse Width: 0.5 ms
Lead Channel Setting Sensing Sensitivity: 2 mV
Pulse Gen Model: 2272
Pulse Gen Serial Number: 8128493

## 2022-02-06 NOTE — Progress Notes (Signed)

## 2022-02-06 NOTE — Patient Instructions (Signed)

## 2022-02-08 DIAGNOSIS — L89154 Pressure ulcer of sacral region, stage 4: Secondary | ICD-10-CM | POA: Diagnosis not present

## 2022-02-08 DIAGNOSIS — L97212 Non-pressure chronic ulcer of right calf with fat layer exposed: Secondary | ICD-10-CM | POA: Diagnosis not present

## 2022-02-08 DIAGNOSIS — L89314 Pressure ulcer of right buttock, stage 4: Secondary | ICD-10-CM | POA: Diagnosis not present

## 2022-02-08 DIAGNOSIS — R609 Edema, unspecified: Secondary | ICD-10-CM | POA: Diagnosis not present

## 2022-02-08 DIAGNOSIS — I872 Venous insufficiency (chronic) (peripheral): Secondary | ICD-10-CM | POA: Diagnosis not present

## 2022-02-08 DIAGNOSIS — E46 Unspecified protein-calorie malnutrition: Secondary | ICD-10-CM | POA: Diagnosis not present

## 2022-02-08 DIAGNOSIS — I878 Other specified disorders of veins: Secondary | ICD-10-CM | POA: Diagnosis not present

## 2022-02-18 DIAGNOSIS — E119 Type 2 diabetes mellitus without complications: Secondary | ICD-10-CM | POA: Diagnosis not present

## 2022-02-18 DIAGNOSIS — I5033 Acute on chronic diastolic (congestive) heart failure: Secondary | ICD-10-CM | POA: Diagnosis not present

## 2022-02-18 DIAGNOSIS — L89310 Pressure ulcer of right buttock, unstageable: Secondary | ICD-10-CM | POA: Diagnosis not present

## 2022-02-18 DIAGNOSIS — I951 Orthostatic hypotension: Secondary | ICD-10-CM | POA: Diagnosis not present

## 2022-02-18 DIAGNOSIS — I441 Atrioventricular block, second degree: Secondary | ICD-10-CM | POA: Diagnosis not present

## 2022-02-18 DIAGNOSIS — Z48812 Encounter for surgical aftercare following surgery on the circulatory system: Secondary | ICD-10-CM | POA: Diagnosis not present

## 2022-02-18 DIAGNOSIS — I110 Hypertensive heart disease with heart failure: Secondary | ICD-10-CM | POA: Diagnosis not present

## 2022-02-18 DIAGNOSIS — I480 Paroxysmal atrial fibrillation: Secondary | ICD-10-CM | POA: Diagnosis not present

## 2022-02-18 DIAGNOSIS — D649 Anemia, unspecified: Secondary | ICD-10-CM | POA: Diagnosis not present

## 2022-02-26 ENCOUNTER — Telehealth: Payer: Self-pay | Admitting: Cardiology

## 2022-02-26 NOTE — Telephone Encounter (Signed)
Spoke with the patient's wife who states that the patient got a new mask for his CPAP and it attaches with two small magnets. They wanted to make sure that this was okay with his pacemaker. Advised that this was fine as long as the mask stays on his face and he does not place magnets on his chest over his pacemaker. She verbalized understanding.

## 2022-02-26 NOTE — Telephone Encounter (Signed)
Wife is calling in to see if magnets that patient has on his sleep machine, if will affect the patient pacemaker. Please advise

## 2022-03-13 DIAGNOSIS — L89154 Pressure ulcer of sacral region, stage 4: Secondary | ICD-10-CM | POA: Diagnosis not present

## 2022-03-13 DIAGNOSIS — L89314 Pressure ulcer of right buttock, stage 4: Secondary | ICD-10-CM | POA: Diagnosis not present

## 2022-03-13 DIAGNOSIS — I872 Venous insufficiency (chronic) (peripheral): Secondary | ICD-10-CM | POA: Diagnosis not present

## 2022-03-13 DIAGNOSIS — L97212 Non-pressure chronic ulcer of right calf with fat layer exposed: Secondary | ICD-10-CM | POA: Diagnosis not present

## 2022-03-13 DIAGNOSIS — M8668 Other chronic osteomyelitis, other site: Secondary | ICD-10-CM | POA: Diagnosis not present

## 2022-03-15 DIAGNOSIS — M8668 Other chronic osteomyelitis, other site: Secondary | ICD-10-CM | POA: Diagnosis not present

## 2022-03-15 DIAGNOSIS — Z950 Presence of cardiac pacemaker: Secondary | ICD-10-CM | POA: Diagnosis not present

## 2022-03-15 DIAGNOSIS — L89154 Pressure ulcer of sacral region, stage 4: Secondary | ICD-10-CM | POA: Diagnosis not present

## 2022-03-19 DIAGNOSIS — E118 Type 2 diabetes mellitus with unspecified complications: Secondary | ICD-10-CM | POA: Diagnosis not present

## 2022-03-19 DIAGNOSIS — I10 Essential (primary) hypertension: Secondary | ICD-10-CM | POA: Diagnosis not present

## 2022-03-19 DIAGNOSIS — L89150 Pressure ulcer of sacral region, unstageable: Secondary | ICD-10-CM | POA: Diagnosis not present

## 2022-03-19 DIAGNOSIS — E785 Hyperlipidemia, unspecified: Secondary | ICD-10-CM | POA: Diagnosis not present

## 2022-03-19 DIAGNOSIS — M5136 Other intervertebral disc degeneration, lumbar region: Secondary | ICD-10-CM | POA: Diagnosis not present

## 2022-03-20 DIAGNOSIS — J449 Chronic obstructive pulmonary disease, unspecified: Secondary | ICD-10-CM | POA: Diagnosis not present

## 2022-03-20 DIAGNOSIS — I441 Atrioventricular block, second degree: Secondary | ICD-10-CM | POA: Diagnosis not present

## 2022-03-20 DIAGNOSIS — I480 Paroxysmal atrial fibrillation: Secondary | ICD-10-CM | POA: Diagnosis not present

## 2022-03-20 DIAGNOSIS — I951 Orthostatic hypotension: Secondary | ICD-10-CM | POA: Diagnosis not present

## 2022-03-20 DIAGNOSIS — L89310 Pressure ulcer of right buttock, unstageable: Secondary | ICD-10-CM | POA: Diagnosis not present

## 2022-03-20 DIAGNOSIS — E119 Type 2 diabetes mellitus without complications: Secondary | ICD-10-CM | POA: Diagnosis not present

## 2022-03-20 DIAGNOSIS — I110 Hypertensive heart disease with heart failure: Secondary | ICD-10-CM | POA: Diagnosis not present

## 2022-03-20 DIAGNOSIS — I5032 Chronic diastolic (congestive) heart failure: Secondary | ICD-10-CM | POA: Diagnosis not present

## 2022-03-20 DIAGNOSIS — D649 Anemia, unspecified: Secondary | ICD-10-CM | POA: Diagnosis not present

## 2022-03-21 DIAGNOSIS — L89154 Pressure ulcer of sacral region, stage 4: Secondary | ICD-10-CM | POA: Diagnosis not present

## 2022-03-21 DIAGNOSIS — M8668 Other chronic osteomyelitis, other site: Secondary | ICD-10-CM | POA: Diagnosis not present

## 2022-03-21 DIAGNOSIS — L97212 Non-pressure chronic ulcer of right calf with fat layer exposed: Secondary | ICD-10-CM | POA: Diagnosis not present

## 2022-03-21 DIAGNOSIS — I872 Venous insufficiency (chronic) (peripheral): Secondary | ICD-10-CM | POA: Diagnosis not present

## 2022-03-25 DIAGNOSIS — N401 Enlarged prostate with lower urinary tract symptoms: Secondary | ICD-10-CM | POA: Diagnosis not present

## 2022-03-25 DIAGNOSIS — R339 Retention of urine, unspecified: Secondary | ICD-10-CM | POA: Diagnosis not present

## 2022-04-03 DIAGNOSIS — L89154 Pressure ulcer of sacral region, stage 4: Secondary | ICD-10-CM | POA: Diagnosis not present

## 2022-04-03 DIAGNOSIS — L89314 Pressure ulcer of right buttock, stage 4: Secondary | ICD-10-CM | POA: Diagnosis not present

## 2022-04-03 DIAGNOSIS — I872 Venous insufficiency (chronic) (peripheral): Secondary | ICD-10-CM | POA: Diagnosis not present

## 2022-04-03 DIAGNOSIS — M8668 Other chronic osteomyelitis, other site: Secondary | ICD-10-CM | POA: Diagnosis not present

## 2022-04-03 DIAGNOSIS — L97212 Non-pressure chronic ulcer of right calf with fat layer exposed: Secondary | ICD-10-CM | POA: Diagnosis not present

## 2022-04-08 DIAGNOSIS — E119 Type 2 diabetes mellitus without complications: Secondary | ICD-10-CM | POA: Diagnosis not present

## 2022-04-19 DIAGNOSIS — M8668 Other chronic osteomyelitis, other site: Secondary | ICD-10-CM | POA: Diagnosis not present

## 2022-04-19 DIAGNOSIS — L89154 Pressure ulcer of sacral region, stage 4: Secondary | ICD-10-CM | POA: Diagnosis not present

## 2022-04-22 ENCOUNTER — Ambulatory Visit: Payer: Medicare HMO | Attending: Cardiology | Admitting: Cardiology

## 2022-04-22 ENCOUNTER — Encounter: Payer: Self-pay | Admitting: Cardiology

## 2022-04-22 ENCOUNTER — Ambulatory Visit (INDEPENDENT_AMBULATORY_CARE_PROVIDER_SITE_OTHER): Payer: Medicare HMO

## 2022-04-22 DIAGNOSIS — R55 Syncope and collapse: Secondary | ICD-10-CM

## 2022-04-22 DIAGNOSIS — I5032 Chronic diastolic (congestive) heart failure: Secondary | ICD-10-CM | POA: Diagnosis not present

## 2022-04-22 DIAGNOSIS — E119 Type 2 diabetes mellitus without complications: Secondary | ICD-10-CM | POA: Diagnosis not present

## 2022-04-22 DIAGNOSIS — D649 Anemia, unspecified: Secondary | ICD-10-CM | POA: Diagnosis not present

## 2022-04-22 DIAGNOSIS — I110 Hypertensive heart disease with heart failure: Secondary | ICD-10-CM | POA: Diagnosis not present

## 2022-04-22 DIAGNOSIS — L89310 Pressure ulcer of right buttock, unstageable: Secondary | ICD-10-CM | POA: Diagnosis not present

## 2022-04-22 DIAGNOSIS — I480 Paroxysmal atrial fibrillation: Secondary | ICD-10-CM | POA: Diagnosis not present

## 2022-04-22 DIAGNOSIS — I441 Atrioventricular block, second degree: Secondary | ICD-10-CM

## 2022-04-22 DIAGNOSIS — R001 Bradycardia, unspecified: Secondary | ICD-10-CM

## 2022-04-22 DIAGNOSIS — I951 Orthostatic hypotension: Secondary | ICD-10-CM | POA: Diagnosis not present

## 2022-04-22 DIAGNOSIS — J449 Chronic obstructive pulmonary disease, unspecified: Secondary | ICD-10-CM | POA: Diagnosis not present

## 2022-04-22 LAB — CUP PACEART INCLINIC DEVICE CHECK
Battery Remaining Longevity: 105 mo
Battery Voltage: 3.01 V
Brady Statistic RA Percent Paced: 80 %
Brady Statistic RV Percent Paced: 99.88 %
Date Time Interrogation Session: 20240318092502
Implantable Lead Connection Status: 753985
Implantable Lead Connection Status: 753985
Implantable Lead Implant Date: 20231214
Implantable Lead Implant Date: 20231214
Implantable Lead Location: 753859
Implantable Lead Location: 753860
Implantable Pulse Generator Implant Date: 20231214
Lead Channel Impedance Value: 437.5 Ohm
Lead Channel Impedance Value: 487.5 Ohm
Lead Channel Pacing Threshold Amplitude: 0.75 V
Lead Channel Pacing Threshold Amplitude: 0.75 V
Lead Channel Pacing Threshold Amplitude: 0.75 V
Lead Channel Pacing Threshold Amplitude: 0.75 V
Lead Channel Pacing Threshold Pulse Width: 0.5 ms
Lead Channel Pacing Threshold Pulse Width: 0.5 ms
Lead Channel Pacing Threshold Pulse Width: 0.5 ms
Lead Channel Pacing Threshold Pulse Width: 0.5 ms
Lead Channel Sensing Intrinsic Amplitude: 0.6 mV
Lead Channel Sensing Intrinsic Amplitude: 7.8 mV
Lead Channel Setting Pacing Amplitude: 0.875
Lead Channel Setting Pacing Amplitude: 2.5 V
Lead Channel Setting Pacing Pulse Width: 0.5 ms
Lead Channel Setting Sensing Sensitivity: 2 mV
Pulse Gen Model: 2272
Pulse Gen Serial Number: 8128493

## 2022-04-22 NOTE — Patient Instructions (Signed)
Medication Instructions:  Your physician recommends that you continue on your current medications as directed. Please refer to the Current Medication list given to you today.  *If you need a refill on your cardiac medications before your next appointment, please call your pharmacy*   Lab Work: None ordered   Testing/Procedures: None ordered   Follow-Up: At [facility], you and your health needs are our priority.  As part of our continuing mission to provide you with exceptional heart care, we have created designated Provider Care Teams.  These Care Teams include your primary Cardiologist (physician) and Advanced Practice Providers (APPs -  Physician Assistants and Nurse Practitioners) who all work together to provide you with the care you need, when you need it.  We recommend signing up for the patient portal called "MyChart".  Sign up information is provided on this After Visit Summary.  MyChart is used to connect with patients for Virtual Visits (Telemedicine).  Patients are able to view lab/test results, encounter notes, upcoming appointments, etc.  Non-urgent messages can be sent to your provider as well.   To learn more about what you can do with MyChart, go to [email or web address].    Remote monitoring is used to monitor your Pacemaker or ICD from home. This monitoring reduces the number of office visits required to check your device to one time per year. It allows [place] to keep an eye on the functioning of your device to ensure it is working properly. You are scheduled for a device check from home on 07/22/22. You may send your transmission at any time that day. If you have a wireless device, the transmission will be sent automatically. After your physician reviews your transmission, you will receive a postcard with your next transmission date.  Your next appointment:   1 year(s)  The format for your next appointment:   In Person  Provider:   Allegra Lai, MD    Thank you for  choosing [facility]!!   Trinidad Curet, RN [phone]    Other Instructions

## 2022-04-22 NOTE — Progress Notes (Signed)
Electrophysiology Office Note   Date:  04/22/2022   ID:  Travis Crawford, DOB [birth year], MRN [id number]  PCP:  Lowella Dandy, NP  Cardiologist:   Primary Electrophysiologist:  Jerric Oyen Meredith Leeds, MD    Chief Complaint: pacemaker   History of Present Illness: Travis Crawford is a 72 y.o. male who is being seen today for the evaluation of pacemaker at the request of Moon, Amy A, NP. Presenting today for electrophysiology evaluation.  He has a history significant for diabetes, hypertension, sleep apnea on CPAP.  He presented to the hospital after an episode of syncope and was found to be in second-degree AV block. He is now status post Abbott dual-chamber pacemaker implanted 01/18/2022.  Today, he denies symptoms of palpitations, chest pain, shortness of breath, orthopnea, PND, lower extremity edema, claudication, dizziness, presyncope, syncope, bleeding, or neurologic sequela. The patient is tolerating medications without difficulties.  Since his pacemaker was implanted he has done well.  He has had no chest pain or shortness of breath.  He is able to do all of his daily activities without restriction.  He has greatly improved his level of fatigue.  He is planning on surgery for a sacral ulcer in the next few weeks to months.   Past Medical History:  Diagnosis Date   Arthritis    Complication of anesthesia    woke up early during hemorrhoid surgery   Diabetes mellitus without complication ([place])    type 2   Eosinophilic leukocytosis [id number]   History of kidney stones    one time   Hypertension    Lumbar spondylosis 12/24/2017   Pneumonia    Right hip pain 12/24/2017   RUQ pain 08/17/2021   Sciatica of right side 12/24/2017   Sleep apnea    wears cpap   Spinal stenosis 04/17/2016   Past Surgical History:  Procedure Laterality Date   APPENDECTOMY     CYST REMOVAL TRUNK     breast on left   HEMORRHOID SURGERY     HERNIA REPAIR     umbilical hernia   LUMBAR  LAMINECTOMY/DECOMPRESSION MICRODISCECTOMY N/A 04/17/2016   Procedure: LUMBAR LAMINECTOMY/DECOMPRESSION MICRODISCECTOMY 2 LEVELS;  Surgeon: Melina Schools, MD;  Location: [facility];  Service: Orthopedics;  Laterality: N/A;   PACEMAKER IMPLANT N/A 01/17/2022   Procedure: PACEMAKER IMPLANT;  Surgeon: Constance Haw, MD;  Location: [facility];  Service: Cardiovascular;  Laterality: N/A;     Current Outpatient Medications  Medication Sig Dispense Refill   acetaminophen (TYLENOL) 325 MG tablet Take 650 mg by mouth every 6 (six) hours as needed for mild pain or moderate pain.     albuterol (VENTOLIN HFA) 108 (90 Base) MCG/ACT inhaler Inhale 2 puffs into the lungs every 6 (six) hours as needed for wheezing or shortness of breath.     bethanechol (URECHOLINE) 10 MG tablet Take 10 mg by mouth 2 (two) times daily after a meal.     ipratropium-albuterol (DUONEB) 0.5-2.5 (3) MG/3ML SOLN Inhale into the lungs as directed.     lovastatin (MEVACOR) 20 MG tablet Take 20 mg by mouth at bedtime.     Polyvinyl Alcohol-Povidone (CLEAR EYES NATURAL TEARS OP) Place 1 drop into both eyes daily as needed.     potassium chloride SA (K-DUR,KLOR-CON) 20 MEQ tablet Take 20 mEq by mouth daily.     torsemide (DEMADEX) 20 MG tablet Take 20 mg by mouth daily.     No current facility-administered medications for this visit.  Allergies:   Ceftriaxone and No known allergies   Social History:  The patient  reports that he quit smoking about 26 years ago. His smoking use included cigarettes. He has quit using smokeless tobacco.  His smokeless tobacco use included chew. He reports current alcohol use of about 21.0 standard drinks of alcohol per week. He reports that he does not use drugs.   Family History:  The patient's family history includes Cancer in his mother.    ROS:  Please see the history of present illness.   Otherwise, review of systems is positive for none.   All other systems are reviewed and negative.     PHYSICAL EXAM: VS:  There were no vitals taken for this visit. , BMI There is no height or weight on file to calculate BMI. GEN: Well nourished, well developed, in no acute distress  HEENT: normal  Neck: no JVD, carotid bruits, or masses Cardiac: RRR; no murmurs, rubs, or gallops,no edema  Respiratory:  clear to auscultation bilaterally, normal work of breathing GI: soft, nontender, nondistended, + BS MS: no deformity or atrophy  Skin: warm and dry, device pocket is well healed Neuro:  Strength and sensation are intact Psych: euthymic mood, full affect  EKG:  EKG is ordered today. Personal review of the ekg ordered shows AV paced  Device interrogation is reviewed today in detail.  See PaceArt for details.   Recent Labs: 01/16/2022: BUN 18; Creatinine, Ser 0.75; Hemoglobin 10.5; Platelets 327; Potassium 3.9; Sodium 140    Lipid Panel  No results found for: "CHOL", "TRIG", "HDL", "CHOLHDL", "VLDL", "LDLCALC", "LDLDIRECT"   Wt Readings from Last 3 Encounters:  01/16/22 195 lb 12.8 oz (88.8 kg)  04/17/16 218 lb (98.9 kg)  04/10/16 218 lb 14.4 oz (99.3 kg)      Other studies Reviewed: Additional studies/ records that were reviewed today include: TTE 07/06/21  Review of the above records today demonstrates:  Mild concentric LVH Ejection fraction 55 to 60% Moderately dilated left atrium Mild mitral regurgitation   ASSESSMENT AND PLAN:  1.  Mobitz 2 second-degree AV block: Status post Abbott dual-chamber pacemaker implanted 01/17/2022.  Device functioning appropriately.  Sensing, threshold, impedance within normal limits.  [name] continue with current management.  2.  Hypertension: Currently well-controlled    Current medicines are reviewed at length with the patient today.   The patient does not have concerns regarding his medicines.  The following changes were made today:  none  Labs/ tests ordered today include:  Orders Placed This Encounter  Procedures   EKG  12-Lead     Disposition:   FU with [name] 1 year  Signed, Connee Ikner Meredith Leeds, MD  04/22/2022 9:12 AM     [facility] [address] [place] [address] [phone] (office) [phone] (fax)

## 2022-04-24 LAB — CUP PACEART REMOTE DEVICE CHECK
Battery Remaining Longevity: 76 mo
Battery Remaining Percentage: 95.5 %
Battery Voltage: 3.01 V
Brady Statistic AP VP Percent: 80 %
Brady Statistic AP VS Percent: 1 %
Brady Statistic AS VP Percent: 20 %
Brady Statistic AS VS Percent: 1 %
Brady Statistic RA Percent Paced: 80 %
Brady Statistic RV Percent Paced: 99 %
Date Time Interrogation Session: 20240318020013
Implantable Lead Connection Status: 753985
Implantable Lead Connection Status: 753985
Implantable Lead Implant Date: 20231214
Implantable Lead Implant Date: 20231214
Implantable Lead Location: 753859
Implantable Lead Location: 753860
Implantable Pulse Generator Implant Date: 20231214
Lead Channel Impedance Value: 400 Ohm
Lead Channel Impedance Value: 480 Ohm
Lead Channel Pacing Threshold Amplitude: 0.625 V
Lead Channel Pacing Threshold Amplitude: 0.75 V
Lead Channel Pacing Threshold Pulse Width: 0.5 ms
Lead Channel Pacing Threshold Pulse Width: 0.5 ms
Lead Channel Sensing Intrinsic Amplitude: 0.6 mV
Lead Channel Sensing Intrinsic Amplitude: 12 mV
Lead Channel Setting Pacing Amplitude: 0.875
Lead Channel Setting Pacing Amplitude: 3.5 V
Lead Channel Setting Pacing Pulse Width: 0.5 ms
Lead Channel Setting Sensing Sensitivity: 2 mV
Pulse Gen Model: 2272
Pulse Gen Serial Number: 8128493

## 2022-05-01 DIAGNOSIS — M8668 Other chronic osteomyelitis, other site: Secondary | ICD-10-CM | POA: Diagnosis not present

## 2022-05-01 DIAGNOSIS — L89154 Pressure ulcer of sacral region, stage 4: Secondary | ICD-10-CM | POA: Diagnosis not present

## 2022-05-20 DIAGNOSIS — I5032 Chronic diastolic (congestive) heart failure: Secondary | ICD-10-CM | POA: Diagnosis not present

## 2022-05-20 DIAGNOSIS — D649 Anemia, unspecified: Secondary | ICD-10-CM | POA: Diagnosis not present

## 2022-05-20 DIAGNOSIS — J449 Chronic obstructive pulmonary disease, unspecified: Secondary | ICD-10-CM | POA: Diagnosis not present

## 2022-05-20 DIAGNOSIS — L89310 Pressure ulcer of right buttock, unstageable: Secondary | ICD-10-CM | POA: Diagnosis not present

## 2022-05-20 DIAGNOSIS — I441 Atrioventricular block, second degree: Secondary | ICD-10-CM | POA: Diagnosis not present

## 2022-05-20 DIAGNOSIS — E119 Type 2 diabetes mellitus without complications: Secondary | ICD-10-CM | POA: Diagnosis not present

## 2022-05-20 DIAGNOSIS — I110 Hypertensive heart disease with heart failure: Secondary | ICD-10-CM | POA: Diagnosis not present

## 2022-05-20 DIAGNOSIS — I480 Paroxysmal atrial fibrillation: Secondary | ICD-10-CM | POA: Diagnosis not present

## 2022-05-20 DIAGNOSIS — I951 Orthostatic hypotension: Secondary | ICD-10-CM | POA: Diagnosis not present

## 2022-05-22 DIAGNOSIS — L89154 Pressure ulcer of sacral region, stage 4: Secondary | ICD-10-CM | POA: Diagnosis not present

## 2022-05-22 DIAGNOSIS — L988 Other specified disorders of the skin and subcutaneous tissue: Secondary | ICD-10-CM | POA: Diagnosis not present

## 2022-05-22 DIAGNOSIS — R609 Edema, unspecified: Secondary | ICD-10-CM | POA: Diagnosis not present

## 2022-05-22 DIAGNOSIS — E46 Unspecified protein-calorie malnutrition: Secondary | ICD-10-CM | POA: Diagnosis not present

## 2022-05-22 DIAGNOSIS — M8668 Other chronic osteomyelitis, other site: Secondary | ICD-10-CM | POA: Diagnosis not present

## 2022-06-03 NOTE — Progress Notes (Signed)
Remote pacemaker transmission.   

## 2022-06-17 DIAGNOSIS — L89310 Pressure ulcer of right buttock, unstageable: Secondary | ICD-10-CM | POA: Diagnosis not present

## 2022-06-17 DIAGNOSIS — I951 Orthostatic hypotension: Secondary | ICD-10-CM | POA: Diagnosis not present

## 2022-06-17 DIAGNOSIS — I441 Atrioventricular block, second degree: Secondary | ICD-10-CM | POA: Diagnosis not present

## 2022-06-17 DIAGNOSIS — I480 Paroxysmal atrial fibrillation: Secondary | ICD-10-CM | POA: Diagnosis not present

## 2022-06-17 DIAGNOSIS — E119 Type 2 diabetes mellitus without complications: Secondary | ICD-10-CM | POA: Diagnosis not present

## 2022-06-17 DIAGNOSIS — D649 Anemia, unspecified: Secondary | ICD-10-CM | POA: Diagnosis not present

## 2022-06-17 DIAGNOSIS — J449 Chronic obstructive pulmonary disease, unspecified: Secondary | ICD-10-CM | POA: Diagnosis not present

## 2022-06-17 DIAGNOSIS — I110 Hypertensive heart disease with heart failure: Secondary | ICD-10-CM | POA: Diagnosis not present

## 2022-06-17 DIAGNOSIS — I5032 Chronic diastolic (congestive) heart failure: Secondary | ICD-10-CM | POA: Diagnosis not present

## 2022-06-18 DIAGNOSIS — N401 Enlarged prostate with lower urinary tract symptoms: Secondary | ICD-10-CM | POA: Diagnosis not present

## 2022-06-18 DIAGNOSIS — I10 Essential (primary) hypertension: Secondary | ICD-10-CM | POA: Diagnosis not present

## 2022-06-18 DIAGNOSIS — I5032 Chronic diastolic (congestive) heart failure: Secondary | ICD-10-CM | POA: Diagnosis not present

## 2022-06-18 DIAGNOSIS — E785 Hyperlipidemia, unspecified: Secondary | ICD-10-CM | POA: Diagnosis not present

## 2022-06-18 DIAGNOSIS — R262 Difficulty in walking, not elsewhere classified: Secondary | ICD-10-CM | POA: Diagnosis not present

## 2022-06-18 DIAGNOSIS — L89150 Pressure ulcer of sacral region, unstageable: Secondary | ICD-10-CM | POA: Diagnosis not present

## 2022-06-18 DIAGNOSIS — M5136 Other intervertebral disc degeneration, lumbar region: Secondary | ICD-10-CM | POA: Diagnosis not present

## 2022-06-18 DIAGNOSIS — Z139 Encounter for screening, unspecified: Secondary | ICD-10-CM | POA: Diagnosis not present

## 2022-06-18 DIAGNOSIS — E118 Type 2 diabetes mellitus with unspecified complications: Secondary | ICD-10-CM | POA: Diagnosis not present

## 2022-07-12 DIAGNOSIS — H25812 Combined forms of age-related cataract, left eye: Secondary | ICD-10-CM | POA: Diagnosis not present

## 2022-07-12 DIAGNOSIS — Z01818 Encounter for other preprocedural examination: Secondary | ICD-10-CM | POA: Diagnosis not present

## 2022-07-12 DIAGNOSIS — H25813 Combined forms of age-related cataract, bilateral: Secondary | ICD-10-CM | POA: Diagnosis not present

## 2022-07-22 ENCOUNTER — Ambulatory Visit (INDEPENDENT_AMBULATORY_CARE_PROVIDER_SITE_OTHER): Payer: Medicare HMO

## 2022-07-22 DIAGNOSIS — I441 Atrioventricular block, second degree: Secondary | ICD-10-CM

## 2022-07-23 LAB — CUP PACEART REMOTE DEVICE CHECK
Battery Remaining Longevity: 106 mo
Battery Remaining Percentage: 95.5 %
Battery Voltage: 3.01 V
Brady Statistic AP VP Percent: 76 %
Brady Statistic AP VS Percent: 1 %
Brady Statistic AS VP Percent: 24 %
Brady Statistic AS VS Percent: 1 %
Brady Statistic RA Percent Paced: 76 %
Brady Statistic RV Percent Paced: 99 %
Date Time Interrogation Session: 20240617020014
Implantable Lead Connection Status: 753985
Implantable Lead Connection Status: 753985
Implantable Lead Implant Date: 20231214
Implantable Lead Implant Date: 20231214
Implantable Lead Location: 753859
Implantable Lead Location: 753860
Implantable Pulse Generator Implant Date: 20231214
Lead Channel Impedance Value: 490 Ohm
Lead Channel Impedance Value: 530 Ohm
Lead Channel Pacing Threshold Amplitude: 0.75 V
Lead Channel Pacing Threshold Amplitude: 0.75 V
Lead Channel Pacing Threshold Pulse Width: 0.5 ms
Lead Channel Pacing Threshold Pulse Width: 0.5 ms
Lead Channel Sensing Intrinsic Amplitude: 0.8 mV
Lead Channel Sensing Intrinsic Amplitude: 7.8 mV
Lead Channel Setting Pacing Amplitude: 1 V
Lead Channel Setting Pacing Amplitude: 2.5 V
Lead Channel Setting Pacing Pulse Width: 0.5 ms
Lead Channel Setting Sensing Sensitivity: 2 mV
Pulse Gen Model: 2272
Pulse Gen Serial Number: 8128493

## 2022-07-25 DIAGNOSIS — E43 Unspecified severe protein-calorie malnutrition: Secondary | ICD-10-CM | POA: Diagnosis not present

## 2022-07-25 DIAGNOSIS — D649 Anemia, unspecified: Secondary | ICD-10-CM | POA: Diagnosis not present

## 2022-07-25 DIAGNOSIS — I441 Atrioventricular block, second degree: Secondary | ICD-10-CM | POA: Diagnosis not present

## 2022-07-25 DIAGNOSIS — I5032 Chronic diastolic (congestive) heart failure: Secondary | ICD-10-CM | POA: Diagnosis not present

## 2022-07-25 DIAGNOSIS — I951 Orthostatic hypotension: Secondary | ICD-10-CM | POA: Diagnosis not present

## 2022-07-25 DIAGNOSIS — J449 Chronic obstructive pulmonary disease, unspecified: Secondary | ICD-10-CM | POA: Diagnosis not present

## 2022-07-25 DIAGNOSIS — E119 Type 2 diabetes mellitus without complications: Secondary | ICD-10-CM | POA: Diagnosis not present

## 2022-07-25 DIAGNOSIS — I480 Paroxysmal atrial fibrillation: Secondary | ICD-10-CM | POA: Diagnosis not present

## 2022-07-25 DIAGNOSIS — I110 Hypertensive heart disease with heart failure: Secondary | ICD-10-CM | POA: Diagnosis not present

## 2022-07-30 DIAGNOSIS — H25812 Combined forms of age-related cataract, left eye: Secondary | ICD-10-CM | POA: Diagnosis not present

## 2022-07-30 DIAGNOSIS — I482 Chronic atrial fibrillation, unspecified: Secondary | ICD-10-CM | POA: Diagnosis not present

## 2022-07-30 DIAGNOSIS — H269 Unspecified cataract: Secondary | ICD-10-CM | POA: Diagnosis not present

## 2022-07-30 DIAGNOSIS — G4733 Obstructive sleep apnea (adult) (pediatric): Secondary | ICD-10-CM | POA: Diagnosis not present

## 2022-07-30 DIAGNOSIS — Z950 Presence of cardiac pacemaker: Secondary | ICD-10-CM | POA: Diagnosis not present

## 2022-07-30 DIAGNOSIS — Z87828 Personal history of other (healed) physical injury and trauma: Secondary | ICD-10-CM | POA: Diagnosis not present

## 2022-07-30 DIAGNOSIS — H47233 Glaucomatous optic atrophy, bilateral: Secondary | ICD-10-CM | POA: Diagnosis not present

## 2022-07-30 DIAGNOSIS — E119 Type 2 diabetes mellitus without complications: Secondary | ICD-10-CM | POA: Diagnosis not present

## 2022-07-30 DIAGNOSIS — I110 Hypertensive heart disease with heart failure: Secondary | ICD-10-CM | POA: Diagnosis not present

## 2022-07-30 DIAGNOSIS — J449 Chronic obstructive pulmonary disease, unspecified: Secondary | ICD-10-CM | POA: Diagnosis not present

## 2022-07-30 DIAGNOSIS — I10 Essential (primary) hypertension: Secondary | ICD-10-CM | POA: Diagnosis not present

## 2022-07-30 DIAGNOSIS — I5032 Chronic diastolic (congestive) heart failure: Secondary | ICD-10-CM | POA: Diagnosis not present

## 2022-08-12 NOTE — Progress Notes (Signed)
Remote pacemaker transmission.   

## 2022-08-20 DIAGNOSIS — M5136 Other intervertebral disc degeneration, lumbar region: Secondary | ICD-10-CM | POA: Diagnosis not present

## 2022-08-20 DIAGNOSIS — Z950 Presence of cardiac pacemaker: Secondary | ICD-10-CM | POA: Diagnosis not present

## 2022-08-20 DIAGNOSIS — I10 Essential (primary) hypertension: Secondary | ICD-10-CM | POA: Diagnosis not present

## 2022-08-20 DIAGNOSIS — N401 Enlarged prostate with lower urinary tract symptoms: Secondary | ICD-10-CM | POA: Diagnosis not present

## 2022-08-20 DIAGNOSIS — I5032 Chronic diastolic (congestive) heart failure: Secondary | ICD-10-CM | POA: Diagnosis not present

## 2022-08-20 DIAGNOSIS — S80922A Unspecified superficial injury of left lower leg, initial encounter: Secondary | ICD-10-CM | POA: Diagnosis not present

## 2022-08-20 DIAGNOSIS — L089 Local infection of the skin and subcutaneous tissue, unspecified: Secondary | ICD-10-CM | POA: Diagnosis not present

## 2022-08-20 DIAGNOSIS — E118 Type 2 diabetes mellitus with unspecified complications: Secondary | ICD-10-CM | POA: Diagnosis not present

## 2022-08-20 DIAGNOSIS — E785 Hyperlipidemia, unspecified: Secondary | ICD-10-CM | POA: Diagnosis not present

## 2022-09-17 DIAGNOSIS — K219 Gastro-esophageal reflux disease without esophagitis: Secondary | ICD-10-CM | POA: Diagnosis not present

## 2022-09-17 DIAGNOSIS — I5032 Chronic diastolic (congestive) heart failure: Secondary | ICD-10-CM | POA: Diagnosis not present

## 2022-09-17 DIAGNOSIS — E119 Type 2 diabetes mellitus without complications: Secondary | ICD-10-CM | POA: Diagnosis not present

## 2022-09-17 DIAGNOSIS — Z87891 Personal history of nicotine dependence: Secondary | ICD-10-CM | POA: Diagnosis not present

## 2022-09-17 DIAGNOSIS — Z950 Presence of cardiac pacemaker: Secondary | ICD-10-CM | POA: Diagnosis not present

## 2022-09-17 DIAGNOSIS — I110 Hypertensive heart disease with heart failure: Secondary | ICD-10-CM | POA: Diagnosis not present

## 2022-09-17 DIAGNOSIS — E1136 Type 2 diabetes mellitus with diabetic cataract: Secondary | ICD-10-CM | POA: Diagnosis not present

## 2022-09-17 DIAGNOSIS — J449 Chronic obstructive pulmonary disease, unspecified: Secondary | ICD-10-CM | POA: Diagnosis not present

## 2022-09-17 DIAGNOSIS — H259 Unspecified age-related cataract: Secondary | ICD-10-CM | POA: Diagnosis not present

## 2022-09-17 DIAGNOSIS — H25811 Combined forms of age-related cataract, right eye: Secondary | ICD-10-CM | POA: Diagnosis not present

## 2022-10-21 ENCOUNTER — Ambulatory Visit (INDEPENDENT_AMBULATORY_CARE_PROVIDER_SITE_OTHER): Payer: Medicare HMO

## 2022-10-21 DIAGNOSIS — I441 Atrioventricular block, second degree: Secondary | ICD-10-CM

## 2022-10-22 LAB — CUP PACEART REMOTE DEVICE CHECK
Battery Remaining Longevity: 102 mo
Battery Remaining Percentage: 93 %
Battery Voltage: 3.01 V
Brady Statistic AP VP Percent: 81 %
Brady Statistic AP VS Percent: 1 %
Brady Statistic AS VP Percent: 19 %
Brady Statistic AS VS Percent: 1 %
Brady Statistic RA Percent Paced: 81 %
Brady Statistic RV Percent Paced: 99 %
Date Time Interrogation Session: 20240916020015
Implantable Lead Connection Status: 753985
Implantable Lead Connection Status: 753985
Implantable Lead Implant Date: 20231214
Implantable Lead Implant Date: 20231214
Implantable Lead Location: 753859
Implantable Lead Location: 753860
Implantable Pulse Generator Implant Date: 20231214
Lead Channel Impedance Value: 480 Ohm
Lead Channel Impedance Value: 510 Ohm
Lead Channel Pacing Threshold Amplitude: 0.75 V
Lead Channel Pacing Threshold Amplitude: 0.75 V
Lead Channel Pacing Threshold Pulse Width: 0.5 ms
Lead Channel Pacing Threshold Pulse Width: 0.5 ms
Lead Channel Sensing Intrinsic Amplitude: 1 mV
Lead Channel Sensing Intrinsic Amplitude: 12 mV
Lead Channel Setting Pacing Amplitude: 1 V
Lead Channel Setting Pacing Amplitude: 2.5 V
Lead Channel Setting Pacing Pulse Width: 0.5 ms
Lead Channel Setting Sensing Sensitivity: 2 mV
Pulse Gen Model: 2272
Pulse Gen Serial Number: 8128493

## 2022-10-24 DIAGNOSIS — R339 Retention of urine, unspecified: Secondary | ICD-10-CM | POA: Diagnosis not present

## 2022-10-24 DIAGNOSIS — N401 Enlarged prostate with lower urinary tract symptoms: Secondary | ICD-10-CM | POA: Diagnosis not present

## 2022-10-24 DIAGNOSIS — B3741 Candidal cystitis and urethritis: Secondary | ICD-10-CM | POA: Diagnosis not present

## 2022-11-04 NOTE — Progress Notes (Signed)
Remote pacemaker transmission.   

## 2022-11-21 DIAGNOSIS — R339 Retention of urine, unspecified: Secondary | ICD-10-CM | POA: Diagnosis not present

## 2022-11-21 DIAGNOSIS — N401 Enlarged prostate with lower urinary tract symptoms: Secondary | ICD-10-CM | POA: Diagnosis not present

## 2022-12-23 DIAGNOSIS — E118 Type 2 diabetes mellitus with unspecified complications: Secondary | ICD-10-CM | POA: Diagnosis not present

## 2022-12-23 DIAGNOSIS — I10 Essential (primary) hypertension: Secondary | ICD-10-CM | POA: Diagnosis not present

## 2022-12-23 DIAGNOSIS — E785 Hyperlipidemia, unspecified: Secondary | ICD-10-CM | POA: Diagnosis not present

## 2022-12-23 DIAGNOSIS — Z23 Encounter for immunization: Secondary | ICD-10-CM | POA: Diagnosis not present

## 2023-01-20 ENCOUNTER — Ambulatory Visit (INDEPENDENT_AMBULATORY_CARE_PROVIDER_SITE_OTHER): Payer: Medicare HMO

## 2023-01-20 DIAGNOSIS — I441 Atrioventricular block, second degree: Secondary | ICD-10-CM

## 2023-01-20 DIAGNOSIS — R001 Bradycardia, unspecified: Secondary | ICD-10-CM

## 2023-01-20 LAB — CUP PACEART REMOTE DEVICE CHECK
Battery Remaining Longevity: 96 mo
Battery Remaining Percentage: 90 %
Battery Voltage: 3.01 V
Brady Statistic AP VP Percent: 82 %
Brady Statistic AP VS Percent: 1 %
Brady Statistic AS VP Percent: 18 %
Brady Statistic AS VS Percent: 1 %
Brady Statistic RA Percent Paced: 82 %
Brady Statistic RV Percent Paced: 99 %
Date Time Interrogation Session: 20241216020012
Implantable Lead Connection Status: 753985
Implantable Lead Connection Status: 753985
Implantable Lead Implant Date: 20231214
Implantable Lead Implant Date: 20231214
Implantable Lead Location: 753859
Implantable Lead Location: 753860
Implantable Pulse Generator Implant Date: 20231214
Lead Channel Impedance Value: 400 Ohm
Lead Channel Impedance Value: 490 Ohm
Lead Channel Pacing Threshold Amplitude: 0.625 V
Lead Channel Pacing Threshold Amplitude: 0.75 V
Lead Channel Pacing Threshold Pulse Width: 0.5 ms
Lead Channel Pacing Threshold Pulse Width: 0.5 ms
Lead Channel Sensing Intrinsic Amplitude: 1 mV
Lead Channel Sensing Intrinsic Amplitude: 12 mV
Lead Channel Setting Pacing Amplitude: 0.875
Lead Channel Setting Pacing Amplitude: 2.5 V
Lead Channel Setting Pacing Pulse Width: 0.5 ms
Lead Channel Setting Sensing Sensitivity: 2 mV
Pulse Gen Model: 2272
Pulse Gen Serial Number: 8128493

## 2023-02-11 DIAGNOSIS — Z9181 History of falling: Secondary | ICD-10-CM | POA: Diagnosis not present

## 2023-02-11 DIAGNOSIS — Z0000 Encounter for general adult medical examination without abnormal findings: Secondary | ICD-10-CM | POA: Diagnosis not present

## 2023-02-25 NOTE — Progress Notes (Signed)
Remote pacemaker transmission.   

## 2023-04-09 DIAGNOSIS — N401 Enlarged prostate with lower urinary tract symptoms: Secondary | ICD-10-CM | POA: Diagnosis not present

## 2023-04-09 DIAGNOSIS — R339 Retention of urine, unspecified: Secondary | ICD-10-CM | POA: Diagnosis not present

## 2023-04-09 DIAGNOSIS — R351 Nocturia: Secondary | ICD-10-CM | POA: Diagnosis not present

## 2023-04-21 ENCOUNTER — Ambulatory Visit (INDEPENDENT_AMBULATORY_CARE_PROVIDER_SITE_OTHER): Payer: Medicare HMO

## 2023-04-21 DIAGNOSIS — I441 Atrioventricular block, second degree: Secondary | ICD-10-CM

## 2023-04-22 LAB — CUP PACEART REMOTE DEVICE CHECK
Battery Remaining Longevity: 93 mo
Battery Remaining Percentage: 87 %
Battery Voltage: 2.99 V
Brady Statistic AP VP Percent: 83 %
Brady Statistic AP VS Percent: 1 %
Brady Statistic AS VP Percent: 17 %
Brady Statistic AS VS Percent: 1 %
Brady Statistic RA Percent Paced: 83 %
Brady Statistic RV Percent Paced: 99 %
Date Time Interrogation Session: 20250317020013
Implantable Lead Connection Status: 753985
Implantable Lead Connection Status: 753985
Implantable Lead Implant Date: 20231214
Implantable Lead Implant Date: 20231214
Implantable Lead Location: 753859
Implantable Lead Location: 753860
Implantable Pulse Generator Implant Date: 20231214
Lead Channel Impedance Value: 410 Ohm
Lead Channel Impedance Value: 510 Ohm
Lead Channel Pacing Threshold Amplitude: 0.75 V
Lead Channel Pacing Threshold Amplitude: 0.75 V
Lead Channel Pacing Threshold Pulse Width: 0.5 ms
Lead Channel Pacing Threshold Pulse Width: 0.5 ms
Lead Channel Sensing Intrinsic Amplitude: 1 mV
Lead Channel Sensing Intrinsic Amplitude: 12 mV
Lead Channel Setting Pacing Amplitude: 1 V
Lead Channel Setting Pacing Amplitude: 2.5 V
Lead Channel Setting Pacing Pulse Width: 0.5 ms
Lead Channel Setting Sensing Sensitivity: 2 mV
Pulse Gen Model: 2272
Pulse Gen Serial Number: 8128493

## 2023-04-24 DIAGNOSIS — E1169 Type 2 diabetes mellitus with other specified complication: Secondary | ICD-10-CM | POA: Diagnosis not present

## 2023-06-09 NOTE — Progress Notes (Signed)
 Remote pacemaker transmission.

## 2023-06-09 NOTE — Addendum Note (Signed)
 Addended by: Edra Govern D on: 06/09/2023 10:37 AM   Modules accepted: Orders

## 2023-07-06 NOTE — Progress Notes (Unsigned)
  Electrophysiology Office Note:   Date:  07/07/2023  ID:  Travis Crawford, DOB 07/17/1950, MRN [id number]  Primary Cardiologist: None Primary Heart Failure: None Electrophysiologist: Avree Szczygiel Cortland Ding, MD      History of Present Illness:   Travis Crawford is a 73 y.o. male with h/o diabetes, hypertension, sleep apnea, second-degree AV block seen today for routine electrophysiology followup.   Since last being seen in our clinic the patient reports doing overall well.  He has no chest pain or shortness of breath.  He is able to do all of his daily activities without restriction.  He has no problems with his pacemaker.  He does have back pain and sciatica, but has no cardiac complaints at this time.  he denies chest pain, palpitations, dyspnea, PND, orthopnea, nausea, vomiting, dizziness, syncope, edema, weight gain, or early satiety.   Review of systems complete and found to be negative unless listed in HPI.      EP Information / Studies Reviewed:    EKG is ordered today. Personal review as below.  EKG Interpretation Date/Time:  Monday July 07 2023 09:32:44 EDT Ventricular Rate:  63 PR Interval:  204 QRS Duration:  138 QT Interval:  428 QTC Calculation: 437 R Axis:   34  Text Interpretation: AV dual-paced rhythm When compared with ECG of 18-Jan-2022 04:29, Vent. rate has increased BY   3 BPM Confirmed by Cierria Height ([id number]) on 07/07/2023 9:39:23 AM   PPM Interrogation-  reviewed in detail today,  See PACEART report.  Device History: Abbott Dual Chamber PPM implanted 01/18/2022 for Second Degree AV block  Risk Assessment/Calculations:           Physical Exam:   VS:  BP (!) 152/78   Pulse 69   Ht 5\' 10"  (1.778 m)   Wt 186 lb 6.4 oz (84.6 kg)   SpO2 97%   BMI 26.75 kg/m    Wt Readings from Last 3 Encounters:  07/07/23 186 lb 6.4 oz (84.6 kg)  01/16/22 195 lb 12.8 oz (88.8 kg)  04/17/16 218 lb (98.9 kg)     GEN: Well nourished, well developed in no acute  distress NECK: No JVD; No carotid bruits CARDIAC: Regular rate and rhythm, no murmurs, rubs, gallops RESPIRATORY:  Clear to auscultation without rales, wheezing or rhonchi  ABDOMEN: Soft, non-tender, non-distended EXTREMITIES:  No edema; No deformity   ASSESSMENT AND PLAN:    Second Degree AV block s/p Abbott PPM  Normal PPM function See Pace Art report Sensing, threshold, impedance within normal limits Programming reviewed and appropriate No changes today  2.  Hypertension: Related today.  Well-controlled at home.  Plan per primary physician  Disposition:   Follow up with Dr. Lawana Pray 24 months   Signed, Marilea Gwynne Cortland Ding, MD

## 2023-07-07 ENCOUNTER — Ambulatory Visit: Payer: Medicare HMO | Attending: Cardiology | Admitting: Cardiology

## 2023-07-07 ENCOUNTER — Encounter: Payer: Self-pay | Admitting: Cardiology

## 2023-07-07 ENCOUNTER — Ambulatory Visit: Payer: Self-pay | Admitting: Cardiology

## 2023-07-07 VITALS — BP 152/78 | HR 69 | Ht 70.0 in | Wt 186.4 lb

## 2023-07-07 DIAGNOSIS — I441 Atrioventricular block, second degree: Secondary | ICD-10-CM

## 2023-07-07 DIAGNOSIS — I10 Essential (primary) hypertension: Secondary | ICD-10-CM | POA: Diagnosis not present

## 2023-07-07 DIAGNOSIS — R001 Bradycardia, unspecified: Secondary | ICD-10-CM

## 2023-07-07 LAB — CUP PACEART INCLINIC DEVICE CHECK
Battery Remaining Longevity: 87 mo
Battery Voltage: 3.01 V
Brady Statistic RA Percent Paced: 84 %
Brady Statistic RV Percent Paced: 99.96 %
Date Time Interrogation Session: 20250602113431
Implantable Lead Connection Status: 753985
Implantable Lead Connection Status: 753985
Implantable Lead Implant Date: 20231214
Implantable Lead Implant Date: 20231214
Implantable Lead Location: 753859
Implantable Lead Location: 753860
Implantable Pulse Generator Implant Date: 20231214
Lead Channel Impedance Value: 400 Ohm
Lead Channel Impedance Value: 512.5 Ohm
Lead Channel Pacing Threshold Amplitude: 0.75 V
Lead Channel Pacing Threshold Amplitude: 0.75 V
Lead Channel Pacing Threshold Amplitude: 1 V
Lead Channel Pacing Threshold Amplitude: 1 V
Lead Channel Pacing Threshold Pulse Width: 0.5 ms
Lead Channel Pacing Threshold Pulse Width: 0.5 ms
Lead Channel Pacing Threshold Pulse Width: 0.5 ms
Lead Channel Pacing Threshold Pulse Width: 0.5 ms
Lead Channel Sensing Intrinsic Amplitude: 0.8 mV
Lead Channel Sensing Intrinsic Amplitude: 12 mV
Lead Channel Setting Pacing Amplitude: 1 V
Lead Channel Setting Pacing Amplitude: 2.5 V
Lead Channel Setting Pacing Pulse Width: 0.5 ms
Lead Channel Setting Sensing Sensitivity: 2 mV
Pulse Gen Model: 2272
Pulse Gen Serial Number: 8128493

## 2023-07-07 NOTE — Patient Instructions (Signed)
 Medication Instructions:  Your physician recommends that you continue on your current medications as directed. Please refer to the Current Medication list given to you today.  *If you need a refill on your cardiac medications before your next appointment, please call your pharmacy*  Lab Work: None ordered   Testing/Procedures: None ordered  Follow-Up: At [facility], you and your health needs are our priority.  As part of our continuing mission to provide you with exceptional heart care, our providers are all part of one team.  This team includes your primary Cardiologist (physician) and Advanced Practice Providers or APPs (Physician Assistants and Nurse Practitioners) who all work together to provide you with the care you need, when you need it.  Your next appointment:   2 year(s)  Provider:   Agatha Horsfall, MD    We recommend signing up for the patient portal called "MyChart".  Sign up information is provided on this After Visit Summary.  MyChart is used to connect with patients for Virtual Visits (Telemedicine).  Patients are able to view lab/test results, encounter notes, upcoming appointments, etc.  Non-urgent messages can be sent to your provider as well.   To learn more about what you can do with MyChart, go to [email or web address].   Thank you for choosing [facility]!!

## 2023-07-18 DIAGNOSIS — G4733 Obstructive sleep apnea (adult) (pediatric): Secondary | ICD-10-CM | POA: Diagnosis not present

## 2023-07-18 DIAGNOSIS — Z950 Presence of cardiac pacemaker: Secondary | ICD-10-CM | POA: Diagnosis not present

## 2023-07-18 DIAGNOSIS — I495 Sick sinus syndrome: Secondary | ICD-10-CM | POA: Diagnosis not present

## 2023-07-18 DIAGNOSIS — F17211 Nicotine dependence, cigarettes, in remission: Secondary | ICD-10-CM | POA: Diagnosis not present

## 2023-07-18 DIAGNOSIS — N400 Enlarged prostate without lower urinary tract symptoms: Secondary | ICD-10-CM | POA: Diagnosis not present

## 2023-07-18 DIAGNOSIS — Z008 Encounter for other general examination: Secondary | ICD-10-CM | POA: Diagnosis not present

## 2023-07-18 DIAGNOSIS — R2681 Unsteadiness on feet: Secondary | ICD-10-CM | POA: Diagnosis not present

## 2023-07-18 DIAGNOSIS — Z6826 Body mass index (BMI) 26.0-26.9, adult: Secondary | ICD-10-CM | POA: Diagnosis not present

## 2023-07-18 DIAGNOSIS — G4700 Insomnia, unspecified: Secondary | ICD-10-CM | POA: Diagnosis not present

## 2023-07-18 DIAGNOSIS — E663 Overweight: Secondary | ICD-10-CM | POA: Diagnosis not present

## 2023-07-21 ENCOUNTER — Ambulatory Visit (INDEPENDENT_AMBULATORY_CARE_PROVIDER_SITE_OTHER): Payer: Medicare HMO

## 2023-07-21 DIAGNOSIS — I441 Atrioventricular block, second degree: Secondary | ICD-10-CM | POA: Diagnosis not present

## 2023-07-28 LAB — CUP PACEART REMOTE DEVICE CHECK
Battery Remaining Longevity: 88 mo
Battery Remaining Percentage: 84 %
Battery Voltage: 3.01 V
Brady Statistic AP VP Percent: 90 %
Brady Statistic AP VS Percent: 1 %
Brady Statistic AS VP Percent: 9.9 %
Brady Statistic AS VS Percent: 1 %
Brady Statistic RA Percent Paced: 90 %
Brady Statistic RV Percent Paced: 99 %
Date Time Interrogation Session: 20250616020020
Implantable Lead Connection Status: 753985
Implantable Lead Connection Status: 753985
Implantable Lead Implant Date: 20231214
Implantable Lead Implant Date: 20231214
Implantable Lead Location: 753859
Implantable Lead Location: 753860
Implantable Pulse Generator Implant Date: 20231214
Lead Channel Impedance Value: 390 Ohm
Lead Channel Impedance Value: 480 Ohm
Lead Channel Pacing Threshold Amplitude: 0.625 V
Lead Channel Pacing Threshold Amplitude: 0.75 V
Lead Channel Pacing Threshold Pulse Width: 0.5 ms
Lead Channel Pacing Threshold Pulse Width: 0.5 ms
Lead Channel Sensing Intrinsic Amplitude: 0.9 mV
Lead Channel Sensing Intrinsic Amplitude: 12 mV
Lead Channel Setting Pacing Amplitude: 0.875
Lead Channel Setting Pacing Amplitude: 2.5 V
Lead Channel Setting Pacing Pulse Width: 0.5 ms
Lead Channel Setting Sensing Sensitivity: 2 mV
Pulse Gen Model: 2272
Pulse Gen Serial Number: 8128493

## 2023-07-30 ENCOUNTER — Ambulatory Visit: Payer: Self-pay | Admitting: Cardiology

## 2023-08-01 DIAGNOSIS — I10 Essential (primary) hypertension: Secondary | ICD-10-CM | POA: Diagnosis not present

## 2023-08-01 DIAGNOSIS — L89150 Pressure ulcer of sacral region, unstageable: Secondary | ICD-10-CM | POA: Diagnosis not present

## 2023-08-01 DIAGNOSIS — M5431 Sciatica, right side: Secondary | ICD-10-CM | POA: Diagnosis not present

## 2023-08-01 DIAGNOSIS — J019 Acute sinusitis, unspecified: Secondary | ICD-10-CM | POA: Diagnosis not present

## 2023-08-28 DIAGNOSIS — I10 Essential (primary) hypertension: Secondary | ICD-10-CM | POA: Diagnosis not present

## 2023-08-28 DIAGNOSIS — E785 Hyperlipidemia, unspecified: Secondary | ICD-10-CM | POA: Diagnosis not present

## 2023-08-28 DIAGNOSIS — E1169 Type 2 diabetes mellitus with other specified complication: Secondary | ICD-10-CM | POA: Diagnosis not present

## 2023-08-28 NOTE — Addendum Note (Signed)
 Addended by: TAWNI DRILLING D on: 08/28/2023 03:18 PM   Modules accepted: Orders

## 2023-08-28 NOTE — Progress Notes (Signed)
 Remote pacemaker transmission.

## 2023-09-05 DIAGNOSIS — M5441 Lumbago with sciatica, right side: Secondary | ICD-10-CM | POA: Diagnosis not present

## 2023-09-05 DIAGNOSIS — Z6829 Body mass index (BMI) 29.0-29.9, adult: Secondary | ICD-10-CM | POA: Diagnosis not present

## 2023-09-05 DIAGNOSIS — M5442 Lumbago with sciatica, left side: Secondary | ICD-10-CM | POA: Diagnosis not present

## 2023-09-05 DIAGNOSIS — G8929 Other chronic pain: Secondary | ICD-10-CM | POA: Diagnosis not present

## 2023-09-08 ENCOUNTER — Other Ambulatory Visit (HOSPITAL_COMMUNITY): Payer: Self-pay | Admitting: Neurosurgery

## 2023-09-08 DIAGNOSIS — G8929 Other chronic pain: Secondary | ICD-10-CM

## 2023-09-19 NOTE — Progress Notes (Signed)
  Device system confirmed to be MRI conditional, with implant date > 6 weeks ago, and no evidence of abandoned or epicardial leads in review of most recent CXR  Device last cleared by EP Provider: Charlies Arthur, PA 09/19/2023  Clearance is good through for 1 year as long as parameters remain stable at time of check. If pt undergoes a cardiac device procedure during that time, they should be re-cleared.   Tachy-therapies to be programmed off if applicable with device back to pre-MRI settings after completion of exam.  Abbott[employer] will be present for programming for the MRI.   Suzan Recardo Arna Debby  09/19/2023 10:11 AM

## 2023-09-23 ENCOUNTER — Ambulatory Visit (HOSPITAL_COMMUNITY)
Admission: RE | Admit: 2023-09-23 | Discharge: 2023-09-23 | Disposition: A | Discharge status: Home / Self Care | Source: Ambulatory Visit | Attending: Neurosurgery | Admitting: Neurosurgery

## 2023-09-23 DIAGNOSIS — G8929 Other chronic pain: Secondary | ICD-10-CM

## 2023-09-23 DIAGNOSIS — M5442 Lumbago with sciatica, left side: Secondary | ICD-10-CM | POA: Diagnosis not present

## 2023-09-23 DIAGNOSIS — M5441 Lumbago with sciatica, right side: Secondary | ICD-10-CM

## 2023-09-23 NOTE — Progress Notes (Addendum)
 Patient was monitored by this RN during MRI scan due to presence of a pacemaker. Cardiac rhythm was continuously monitored throughout the procedure. Prior to the start of the scan, the pacemaker was placed in MRI-safe mode by the MRI technician. Following the completion of the scan, the device was returned to its pre-MRI settings. Neurological status and orientation post-procedure were unchanged from baseline.   Pre-procedure Heart Rate (Prior to being placed in MRI safe mode):60 bpm Post-procedure Heart Rate (Once pacemaker is returned to baseline mode): 63 bpm

## 2023-09-26 DIAGNOSIS — M48061 Spinal stenosis, lumbar region without neurogenic claudication: Secondary | ICD-10-CM | POA: Diagnosis not present

## 2023-09-26 DIAGNOSIS — M48062 Spinal stenosis, lumbar region with neurogenic claudication: Secondary | ICD-10-CM | POA: Diagnosis not present

## 2023-09-26 DIAGNOSIS — Z6829 Body mass index (BMI) 29.0-29.9, adult: Secondary | ICD-10-CM | POA: Diagnosis not present

## 2023-09-29 ENCOUNTER — Telehealth: Payer: Self-pay

## 2023-09-29 NOTE — Telephone Encounter (Signed)
   Pre-operative Risk Assessment    Patient Name: Travis Crawford  DOB: September 03, 1950 MRN: [id number]   Date of last office visit: 07/07/23 WILL CAMNITZ, MD Date of next office visit: NONE   Request for Surgical Clearance    Procedure:  LUMBAR LAMINECTOMY  Date of Surgery:  Clearance TBD                                Surgeon:  DR REYES BUDGE Surgeon's Group or Practice Name:  [facility] Phone number:  [phone]  EXT [phone] Fax number:  [phone]  OR  [phone]   ATTN: NIKKI   Type of Clearance Requested:   - Medical    Type of Anesthesia:  General    Additional requests/questions:    SignedLucie DELENA Ku   09/29/2023, 3:56 PM

## 2023-09-30 ENCOUNTER — Telehealth: Payer: Self-pay

## 2023-09-30 ENCOUNTER — Encounter: Payer: Self-pay | Admitting: Cardiology

## 2023-09-30 NOTE — Telephone Encounter (Signed)
 Patient has been scheduled for televisit med rec and consent done     Patient Consent for Virtual Visit         Travis Crawford has provided verbal consent on 09/30/2023 for a virtual visit (video or telephone).   CONSENT FOR VIRTUAL VISIT FOR:  Travis Crawford  By participating in this virtual visit I agree to the following:  I hereby voluntarily request, consent and authorize [facility] and its employed or contracted physicians, physician assistants, nurse practitioners or other licensed health care professionals (the Practitioner), to provide me with telemedicine health care services (the "Services) as deemed necessary by the treating Practitioner. I acknowledge and consent to receive the Services by the Practitioner via telemedicine. I understand that the telemedicine visit will involve communicating with the Practitioner through live audiovisual communication technology and the disclosure of certain medical information by electronic transmission. I acknowledge that I have been given the opportunity to request an in-person assessment or other available alternative prior to the telemedicine visit and am voluntarily participating in the telemedicine visit.  I understand that I have the right to withhold or withdraw my consent to the use of telemedicine in the course of my care at any time, without affecting my right to future care or treatment, and that the Practitioner or I may terminate the telemedicine visit at any time. I understand that I have the right to inspect all information obtained and/or recorded in the course of the telemedicine visit and may receive copies of available information for a reasonable fee.  I understand that some of the potential risks of receiving the Services via telemedicine include:  Delay or interruption in medical evaluation due to technological equipment failure or disruption; Information transmitted may not be sufficient (e.g. poor  resolution of images) to allow for appropriate medical decision making by the Practitioner; and/or  In rare instances, security protocols could fail, causing a breach of personal health information.  Furthermore, I acknowledge that it is my responsibility to provide information about my medical history, conditions and care that is complete and accurate to the best of my ability. I acknowledge that Practitioner's advice, recommendations, and/or decision may be based on factors not within their control, such as incomplete or inaccurate data provided by me or distortions of diagnostic images or specimens that may result from electronic transmissions. I understand that the practice of medicine is not an exact science and that Practitioner makes no warranties or guarantees regarding treatment outcomes. I acknowledge that a copy of this consent can be made available to me via my patient portal [facility] MyChart), or I can request a printed copy by calling the office of [facility].    I understand that my insurance will be billed for this visit.   I have read or had this consent read to me. I understand the contents of this consent, which adequately explains the benefits and risks of the Services being provided via telemedicine.  I have been provided ample opportunity to ask questions regarding this consent and the Services and have had my questions answered to my satisfaction. I give my informed consent for the services to be provided through the use of telemedicine in my medical care

## 2023-09-30 NOTE — Telephone Encounter (Signed)
 Patient has been scheduled for TELEVISIT

## 2023-09-30 NOTE — Telephone Encounter (Signed)
 Primary Cardiologist:None   Preoperative team, please contact this patient and set up a phone call appointment for further preoperative risk assessment. Please obtain consent and complete medication review. Thank you for your help.   I confirm that guidance regarding antiplatelet and oral anticoagulation therapy has been completed and, if necessary, noted below (none requested).  Request routed to device team.   I also confirmed the patient resides in the state of [place] . As per [facility] Medical Board telemedicine laws, the patient must reside in the state in which the provider is licensed.   Rosaline EMERSON Bane, NP-C  09/30/2023, 10:55 AM [address] [place], [place] [address] Office [phone] Fax [phone]

## 2023-09-30 NOTE — Progress Notes (Signed)
 PERIOPERATIVE PRESCRIPTION FOR IMPLANTED CARDIAC DEVICE PROGRAMMING   Patient Information:  Patient: Travis Crawford  MRN: [id number]  Date of Birth: 08/08/1950       Procedure:  LUMBAR LAMINECTOMY   Date of Surgery:  Clearance TBD                                  Surgeon:  DR REYES BUDGE Surgeon's Group or Practice Name:  [facility] Phone number:  [phone]  EXT 8221 Fax number:  [phone]  OR  [phone]   ATTN: NIKKI   Device Information:   Clinic EP Physician:   Dr. Soyla Norton Device Type:  Pacemaker Manufacturer and Phone #:  [employer]: [phone] Pacemaker Dependent?:  Yes Date of Last Device Check:  07/21/2023         Normal Device Function?:  Yes     Electrophysiologist's Recommendations:   Have magnet available. Provide continuous ECG monitoring when magnet is used or reprogramming is to be performed.  Procedure will likely interfere with device function.  Device should be programmed:  Asynchronous pacing during procedure and returned to normal programming after procedure  Per Device Clinic Standing Orders, Almarie ONEIDA Shutter  09/30/2023 11:27 AM

## 2023-10-01 ENCOUNTER — Other Ambulatory Visit: Payer: Self-pay | Admitting: Neurosurgery

## 2023-10-07 NOTE — Progress Notes (Unsigned)
 Virtual Visit via Telephone Note   Because of Travis Crawford co-morbid illnesses, he is at least at moderate risk for complications without adequate follow up.  This format is felt to be most appropriate for this patient at this time.  Due to technical limitations with video connection (technology), today's appointment will be conducted as an audio only telehealth visit, and Travis Crawford verbally agreed to proceed in this manner.   All issues noted in this document were discussed and addressed.  No physical exam could be performed with this format.  Evaluation Performed:  Preoperative cardiovascular risk assessment _____________   Date:  10/07/2023   Patient ID:  Travis Crawford, DOB Jul 22, 1950, MRN [id number] Patient Location:  Home Provider location:   Office  Primary Care Provider:  Erick Greig LABOR, NP Primary Cardiologist:  None  Chief Complaint / Patient Profile   73 y.o. y/o male with a h/o sinus bradycardia, HTN, diastolic CHF, right hip pain, syncope and collapse who is pending lumbar laminectomy and presents today for telephonic preoperative cardiovascular risk assessment.  History of Present Illness    Travis Crawford is a 73 y.o. male who presents via audio/video conferencing for a telehealth visit today.  Pt was last seen in cardiology clinic on 07/07/2023 by Dr. Inocencio.  At that time Travis Crawford was doing well .  The patient is now pending procedure as outlined above. Since his last visit, he continues to be stable from a cardiac standpoint.  Today he denies chest pain, shortness of breath, fatigue, palpitations, melena, hematuria, hemoptysis, diaphoresis, weakness, presyncope, syncope, orthopnea, and PND.   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   History of kidney stones    one time   Hypertension    Lumbar spondylosis 12/24/2017   Pneumonia    Right hip pain 12/24/2017   RUQ pain 08/17/2021   Sciatica of right side 12/24/2017   Sleep apnea    wears cpap   Spinal stenosis 04/17/2016   Past Surgical History:  Procedure Laterality Date   APPENDECTOMY     CYST REMOVAL TRUNK     breast on left   HEMORRHOID SURGERY     HERNIA REPAIR     umbilical hernia   LUMBAR LAMINECTOMY/DECOMPRESSION MICRODISCECTOMY N/A 04/17/2016   Procedure: LUMBAR LAMINECTOMY/DECOMPRESSION MICRODISCECTOMY 2 LEVELS;  Surgeon: Donaciano Sprang, MD;  Location: [facility] OR;  Service: Orthopedics;  Laterality: N/A;   PACEMAKER IMPLANT N/A 01/17/2022   Procedure: PACEMAKER IMPLANT;  Surgeon: Inocencio Soyla Lunger, MD;  Location: [facility] INVASIVE CV LAB;  Service: Cardiovascular;  Laterality: N/A;    Allergies  Allergies  Allergen Reactions   Ceftriaxone  Itching    Bilateral lower extremity   No Known Allergies     Home Medications    Prior to Admission medications   Medication Sig Start Date End Date Taking? Authorizing Provider  acetaminophen  (TYLENOL ) 325 MG tablet Take 650 mg by mouth every 6 (six) hours as needed for mild pain or moderate pain.    [provider]  albuterol  (VENTOLIN  HFA) 108 (90 Base) MCG/ACT inhaler Inhale 2 puffs into the lungs every 6 (six) hours as needed for wheezing or shortness of breath.    [provider]  bethanechol  (URECHOLINE ) 10 MG  tablet Take 10 mg by mouth 2 (two) times daily after a meal. 11/13/21   [provider]  gabapentin (NEURONTIN) 600 MG tablet Take 600 mg by mouth 3 (three) times daily as needed. 09/09/23   [provider]  ipratropium-albuterol  (DUONEB) 0.5-2.5 (3) MG/3ML SOLN Inhale into the lungs as directed. 08/17/21   [provider]  lovastatin (MEVACOR) 20 MG tablet Take 20 mg by mouth at bedtime.    [provider]   oxyCODONE -acetaminophen  (PERCOCET) 10-325 MG tablet Take 1 tablet by mouth 2 (two) times daily as needed.    [provider]  Polyvinyl Alcohol -Povidone (CLEAR EYES NATURAL TEARS OP) Place 1 drop into both eyes daily as needed.    [provider]  potassium chloride  SA (K-DUR,KLOR-CON ) 20 MEQ tablet Take 20 mEq by mouth daily.    [provider]  tamsulosin (FLOMAX) 0.4 MG CAPS capsule Take 0.4 mg by mouth daily after supper. 06/08/23   [provider]  torsemide  (DEMADEX ) 20 MG tablet Take 20 mg by mouth daily.    [provider]  traZODone (DESYREL) 50 MG tablet Take 50-100 mg by mouth at bedtime. 04/17/23   [provider]    Physical Exam    Vital Signs:  Travis Crawford does not have vital signs available for review today.  Given telephonic nature of communication, physical exam is limited. AAOx3. NAD. Normal affect.  Speech and respirations are unlabored.  Accessory Clinical Findings    None  Assessment & Plan    1.  Preoperative Cardiovascular Risk Assessment: LUMBAR LAMINECTOMY   Date of Surgery:  Clearance TBD                                  Surgeon:  DR REYES BUDGE Surgeon's Group or Practice Name:  [facility] Phone number:  [phone]  EXT 8221 Fax number:  [phone]  OR  [phone]      Primary Cardiologist: None  Chart reviewed as part of pre-operative protocol coverage. Given past medical history and time since last visit, based on ACC/AHA guidelines, Travis Crawford would be at acceptable risk for the planned procedure without further cardiovascular testing.   His RCRI is low risk, 0.9% risk of major cardiac event.  He is able to complete greater than 4 METS of physical activity.  Patient was advised that if he develops new symptoms prior to surgery to contact our office to arrange a follow-up appointment.  He verbalized understanding.  I will route this  recommendation to the requesting party via Epic fax function and remove from pre-op pool.       Time:   Today, I have spent 5 minutes with the patient with telehealth technology discussing medical history, symptoms, and management plan.  I spent 10 minutes reviewing patient's past cardiac history and cardiac medications.    Josefa CHRISTELLA Beauvais, NP  10/07/2023, 12:40 PM

## 2023-10-08 ENCOUNTER — Ambulatory Visit: Attending: Cardiovascular Disease

## 2023-10-08 DIAGNOSIS — Z01810 Encounter for preprocedural cardiovascular examination: Secondary | ICD-10-CM | POA: Diagnosis not present

## 2023-10-22 ENCOUNTER — Telehealth: Payer: Self-pay

## 2023-10-22 ENCOUNTER — Telehealth: Payer: Self-pay | Admitting: Cardiology

## 2023-10-22 NOTE — Telephone Encounter (Signed)
 10/22/23 10:47 AM Note Wife is calling back to r/s patient's appt. Please advise       Tried returning call to patient home number and spouses mobile neither number went to voicemail just kept ringing.  Update patients procedure is now dated for 10/29/23  Pt will still need to call office to get schedule for TELE preop appt.  Will update the surgeons office.

## 2023-10-22 NOTE — Telephone Encounter (Signed)
 S/W pt and pts wife. Pt is scheduled for TELE Preop appt 10/24/23. Med Rec and Consent done.  Will update the surgeons office.

## 2023-10-22 NOTE — Telephone Encounter (Signed)
 Med Rec and Consent done.    Patient Consent for Virtual Visit        Travis Crawford has provided verbal consent on 10/22/2023 for a virtual visit (video or telephone).   CONSENT FOR VIRTUAL VISIT FOR:  Travis Crawford  By participating in this virtual visit I agree to the following:  I hereby voluntarily request, consent and authorize [facility] and its employed or contracted physicians, physician assistants, nurse practitioners or other licensed health care professionals (the Practitioner), to provide me with telemedicine health care services (the "Services) as deemed necessary by the treating Practitioner. I acknowledge and consent to receive the Services by the Practitioner via telemedicine. I understand that the telemedicine visit will involve communicating with the Practitioner through live audiovisual communication technology and the disclosure of certain medical information by electronic transmission. I acknowledge that I have been given the opportunity to request an in-person assessment or other available alternative prior to the telemedicine visit and am voluntarily participating in the telemedicine visit.  I understand that I have the right to withhold or withdraw my consent to the use of telemedicine in the course of my care at any time, without affecting my right to future care or treatment, and that the Practitioner or I may terminate the telemedicine visit at any time. I understand that I have the right to inspect all information obtained and/or recorded in the course of the telemedicine visit and may receive copies of available information for a reasonable fee.  I understand that some of the potential risks of receiving the Services via telemedicine include:  Delay or interruption in medical evaluation due to technological equipment failure or disruption; Information transmitted may not be sufficient (e.g. poor resolution of images) to allow for appropriate  medical decision making by the Practitioner; and/or  In rare instances, security protocols could fail, causing a breach of personal health information.  Furthermore, I acknowledge that it is my responsibility to provide information about my medical history, conditions and care that is complete and accurate to the best of my ability. I acknowledge that Practitioner's advice, recommendations, and/or decision may be based on factors not within their control, such as incomplete or inaccurate data provided by me or distortions of diagnostic images or specimens that may result from electronic transmissions. I understand that the practice of medicine is not an exact science and that Practitioner makes no warranties or guarantees regarding treatment outcomes. I acknowledge that a copy of this consent can be made available to me via my patient portal [facility] MyChart), or I can request a printed copy by calling the office of [facility].    I understand that my insurance will be billed for this visit.   I have read or had this consent read to me. I understand the contents of this consent, which adequately explains the benefits and risks of the Services being provided via telemedicine.  I have been provided ample opportunity to ask questions regarding this consent and the Services and have had my questions answered to my satisfaction. I give my informed consent for the services to be provided through the use of telemedicine in my medical care

## 2023-10-22 NOTE — Telephone Encounter (Signed)
 Wife is calling back to r/s patient's appt. Please advise

## 2023-10-24 ENCOUNTER — Ambulatory Visit: Attending: Cardiology | Admitting: Physician Assistant

## 2023-10-24 DIAGNOSIS — Z01810 Encounter for preprocedural cardiovascular examination: Secondary | ICD-10-CM | POA: Diagnosis not present

## 2023-10-24 NOTE — Progress Notes (Signed)
 Virtual Visit via Telephone Note   Because of Travis Crawford co-morbid illnesses, he is at least at moderate risk for complications without adequate follow up.  This format is felt to be most appropriate for this patient at this time.  Due to technical limitations with video connection (technology), today's appointment will be conducted as an audio only telehealth visit, and Primo Massie Mees verbally agreed to proceed in this manner.   All issues noted in this document were discussed and addressed.  No physical exam could be performed with this format.  Evaluation Performed:  Preoperative cardiovascular risk assessment _____________   Date:  10/24/2023   Patient ID:  Travis Crawford, DOB 12/28/1950, MRN [id number] Patient Location:  Home Provider location:   Office  Primary Care Provider:  Erick Greig LABOR, NP Primary Cardiologist:  None  Chief Complaint / Patient Profile   73 y.o. y/o male with a h/o diabetes, HTN, sleep apnea, second-degree AV block s/p PPM who is pending lumbar laminectomy and presents today for telephonic preoperative cardiovascular risk assessment.  History of Present Illness    Travis Crawford is a 73 y.o. male who presents via audio/video conferencing for a telehealth visit today.  Pt was last seen in cardiology clinic on 07/07/23 by Dr. Inocencio.  At that time Caysen Nasean Zapf was doing well. The patient is now pending procedure as outlined above. Since his last visit, he  does have some swelling is his feet and ankles. They go down and stay down when he takes his fluid pill. No chest pain or SOB, no palpitations. He has to walk with a cane but can walk up a flight of stairs. He does a little cooking and cleaning. Church on Sunday.  He does meet minimal METS.  He does have some back pain which holds him back from some activities.  No medication needed to be held.   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   History of kidney stones    one time   Hypertension    Lumbar spondylosis 12/24/2017   Pneumonia    Right hip pain 12/24/2017   RUQ pain 08/17/2021   Sciatica of right side 12/24/2017   Sleep apnea    wears cpap   Spinal stenosis 04/17/2016   Past Surgical History:  Procedure Laterality Date   APPENDECTOMY     CYST REMOVAL TRUNK     breast on left   HEMORRHOID SURGERY     HERNIA REPAIR     umbilical hernia   LUMBAR LAMINECTOMY/DECOMPRESSION MICRODISCECTOMY N/A 04/17/2016   Procedure: LUMBAR LAMINECTOMY/DECOMPRESSION MICRODISCECTOMY 2 LEVELS;  Surgeon: Donaciano Sprang, MD;  Location: [facility] OR;  Service: Orthopedics;  Laterality: N/A;   PACEMAKER IMPLANT N/A 01/17/2022   Procedure: PACEMAKER IMPLANT;  Surgeon: Inocencio Soyla Lunger, MD;  Location: [facility];  Service: Cardiovascular;  Laterality: N/A;    Allergies  Allergies  Allergen Reactions   Rocephin  [Ceftriaxone ] Itching    Bilateral lower extremity    Home Medications    Prior to Admission medications   Medication Sig Start Date End Date Taking? Authorizing Provider  acetaminophen  (TYLENOL ) 500 MG tablet Take 500-1,000 mg by mouth every 6 (six) hours as needed (pain.).    [provider]  bethanechol  (URECHOLINE ) 10 MG tablet Take 10 mg by mouth 3 (three) times daily. 11/13/21   [provider]  gabapentin (NEURONTIN) 600 MG tablet Take 600 mg by mouth 3 (three) times daily as needed (pain.). 09/09/23   [provider]  lovastatin (MEVACOR) 20 MG tablet Take 20 mg by mouth at bedtime.    [provider]  oxyCODONE -acetaminophen  (PERCOCET) 10-325 MG tablet Take 1 tablet by mouth 2 (two) times daily as needed for pain.    [provider]  Polyvinyl Alcohol -Povidone (CLEAR EYES NATURAL TEARS OP) Place 1  drop into both eyes 3 (three) times daily as needed (dry/irritated eyes.).    [provider]  Potassium Chloride  ER 20 MEQ TBCR Take 20 mEq by mouth in the morning. 09/05/23   [provider]  tamsulosin (FLOMAX) 0.4 MG CAPS capsule Take 0.4 mg by mouth daily after supper. 06/08/23   [provider]  torsemide  (DEMADEX ) 20 MG tablet Take 20 mg by mouth in the morning.    [provider]  traZODone (DESYREL) 50 MG tablet Take 50-100 mg by mouth at bedtime. 04/17/23   [provider]    Physical Exam    Vital Signs:  Hutchinson Rita Prom does not have vital signs available for review today.  BP: 153/59  HR 63 Resp: 96%  Given telephonic nature of communication, physical exam is limited. AAOx3. NAD. Normal affect.  Speech and respirations are unlabored.  Accessory Clinical Findings    None  Assessment & Plan    1.  Preoperative Cardiovascular Risk Assessment:  Mr. Crunk's perioperative risk of a major cardiac event is 0.4% according to the Revised Cardiac Risk Index (RCRI).  Therefore, he is at low risk for perioperative complications.   His functional capacity is good at 5.62 METs according to the Duke Activity Status Index (DASI). Recommendations: According to ACC/AHA guidelines, no further cardiovascular testing needed.  The patient may proceed to surgery at acceptable risk.    The patient was advised that if he develops new symptoms prior to surgery to contact our office to arrange for a follow-up visit, and he verbalized understanding.   A copy of this note will be routed to requesting surgeon.  Time:   Today, I have spent 15 minutes with the patient with telehealth technology discussing medical history, symptoms, and management plan.     Orren LOISE Fabry, PA-C  10/24/2023, 3:31 PM

## 2023-10-24 NOTE — Progress Notes (Addendum)
 Surgical Instructions   Your procedure is scheduled on Wednesday, September 24th. Report to [facility] Main Entrance A at 5:30 A.M., then check in with the Admitting office. Any questions or running late day of surgery: call [phone]  Questions prior to your surgery date: call [phone], Monday-Friday, 8am-4pm. If you experience any cold or flu symptoms such as cough, fever, chills, shortness of breath, etc. between now and your scheduled surgery, please notify us  at the above number.     Remember:  Do not eat or drink after midnight the night before your surgery   Take these medicines the morning of surgery with A SIP OF WATER  gabapentin  (NEURONTIN )  tamsulosin  (FLOMAX )   May take these medicines IF NEEDED: acetaminophen  (TYLENOL )  oxyCODONE -acetaminophen  (PERCOCET)  Polyvinyl Alcohol -Povidone (CLEAR EYES NATURAL TEARS OP)    One week prior to surgery, STOP taking any Aspirin (unless otherwise instructed by your surgeon) Aleve, Naproxen, Ibuprofen, Motrin, Advil, Goody's, BC's, all herbal medications, fish oil, and non-prescription vitamins.                     Do NOT Smoke (Tobacco/Vaping) for 24 hours prior to your procedure.  If you use a CPAP at night, you may bring your mask/headgear for your overnight stay.   You will be asked to remove any contacts, glasses, piercing's, hearing aid's, dentures/partials prior to surgery. Please bring cases for these items if needed.    Patients discharged the day of surgery will not be allowed to drive home, and someone needs to stay with them for 24 hours.  SURGICAL WAITING ROOM VISITATION Patients may have no more than 2 support people in the waiting area - these visitors may rotate.   Pre-op nurse will coordinate an appropriate time for 1 ADULT support person, who may not rotate, to accompany patient in pre-op.  Children under the age of 56 must have an adult with them who is not the patient and must remain in the main  waiting area with an adult.  If the patient needs to stay at the hospital during part of their recovery, the visitor guidelines for inpatient rooms apply.  Please refer to the [facility] website for the visitor guidelines for any additional information.   If you received a COVID test during your pre-op visit  it is requested that you wear a mask when out in public, stay away from anyone that may not be feeling well and notify your surgeon if you develop symptoms. If you have been in contact with anyone that has tested positive in the last 10 days please notify you surgeon.      Pre-operative 5 CHG Bathing Instructions   You can play a key role in reducing the risk of infection after surgery. Your skin needs to be as free of germs as possible. You can reduce the number of germs on your skin by washing with CHG (chlorhexidine  gluconate) soap before surgery. CHG is an antiseptic soap that kills germs and continues to kill germs even after washing.   DO NOT use if you have an allergy to chlorhexidine /CHG or antibacterial soaps. If your skin becomes reddened or irritated, stop using the CHG and notify one of our RNs at [phone].   Please shower with the CHG soap starting 4 days before surgery using the following schedule:     Please keep in mind the following:  DO NOT shave, including legs and underarms, starting the day of your first shower.  You may shave your face at any point before/day of surgery.  Place clean sheets on your bed the day you start using CHG soap. Use a clean washcloth (not used since being washed) for each shower. DO NOT sleep with pets once you start using the CHG.   CHG Shower Instructions:  Wash your face and private area with normal soap. If you choose to wash your hair, wash first with your normal shampoo.  After you use shampoo/soap, rinse your hair and body thoroughly to remove shampoo/soap residue.  Turn the water OFF and apply about 3 tablespoons (45 ml)  of CHG soap to a CLEAN washcloth.  Apply CHG soap ONLY FROM YOUR NECK DOWN TO YOUR TOES (washing for 3-5 minutes)  DO NOT use CHG soap on face, private areas, open wounds, or sores.  Pay special attention to the area where your surgery is being performed.  If you are having back surgery, having someone wash your back for you may be helpful. Wait 2 minutes after CHG soap is applied, then you may rinse off the CHG soap.  Pat dry with a clean towel  Put on clean clothes/pajamas   If you choose to wear lotion, please use ONLY the CHG-compatible lotions that are listed below.  Additional instructions for the day of surgery: DO NOT APPLY any lotions, deodorants, cologne, or perfumes.   Do not bring valuables to the hospital. [facility] is not responsible for any belongings/valuables. Do not wear nail polish, gel polish, artificial nails, or any other type of covering on natural nails (fingers and toes) Do not wear jewelry or makeup Put on clean/comfortable clothes.  Please brush your teeth.  Ask your nurse before applying any prescription medications to the skin.     CHG Compatible Lotions   Aveeno Moisturizing lotion  Cetaphil Moisturizing Cream  Cetaphil Moisturizing Lotion  Clairol Herbal Essence Moisturizing Lotion, Dry Skin  Clairol Herbal Essence Moisturizing Lotion, Extra Dry Skin  Clairol Herbal Essence Moisturizing Lotion, Normal Skin  Curel Age Defying Therapeutic Moisturizing Lotion with Alpha Hydroxy  Curel Extreme Care Body Lotion  Curel Soothing Hands Moisturizing Hand Lotion  Curel Therapeutic Moisturizing Cream, Fragrance-Free  Curel Therapeutic Moisturizing Lotion, Fragrance-Free  Curel Therapeutic Moisturizing Lotion, Original Formula  Eucerin Daily Replenishing Lotion  Eucerin Dry Skin Therapy Plus Alpha Hydroxy Crme  Eucerin Dry Skin Therapy Plus Alpha Hydroxy Lotion  Eucerin Original Crme  Eucerin Original Lotion  Eucerin Plus Crme Eucerin Plus Lotion   Eucerin TriLipid Replenishing Lotion  Keri Anti-Bacterial Hand Lotion  Keri Deep Conditioning Original Lotion Dry Skin Formula Softly Scented  Keri Deep Conditioning Original Lotion, Fragrance Free Sensitive Skin Formula  Keri Lotion Fast Absorbing Fragrance Free Sensitive Skin Formula  Keri Lotion Fast Absorbing Softly Scented Dry Skin Formula  Keri Original Lotion  Keri Skin Renewal Lotion Keri Silky Smooth Lotion  Keri Silky Smooth Sensitive Skin Lotion  Nivea Body Creamy Conditioning Oil  Nivea Body Extra Enriched Lotion  Nivea Body Original Lotion  Nivea Body Sheer Moisturizing Lotion Nivea Crme  Nivea Skin Firming Lotion  NutraDerm 30 Skin Lotion  NutraDerm Skin Lotion  NutraDerm Therapeutic Skin Cream  NutraDerm Therapeutic Skin Lotion  ProShield Protective Hand Cream  Provon moisturizing lotion  Please read over the following fact sheets that you were given.

## 2023-10-27 ENCOUNTER — Encounter (HOSPITAL_COMMUNITY)
Admission: RE | Admit: 2023-10-27 | Discharge: 2023-10-27 | Disposition: A | Discharge status: Home / Self Care | Source: Ambulatory Visit | Attending: Neurosurgery | Admitting: Neurosurgery

## 2023-10-27 ENCOUNTER — Encounter (HOSPITAL_COMMUNITY): Payer: Self-pay

## 2023-10-27 ENCOUNTER — Other Ambulatory Visit: Payer: Self-pay

## 2023-10-27 VITALS — BP 164/63 | HR 72 | Temp 97.8°F | Resp 18 | Ht 67.0 in | Wt 188.0 lb

## 2023-10-27 DIAGNOSIS — Z950 Presence of cardiac pacemaker: Secondary | ICD-10-CM | POA: Diagnosis not present

## 2023-10-27 DIAGNOSIS — I441 Atrioventricular block, second degree: Secondary | ICD-10-CM | POA: Insufficient documentation

## 2023-10-27 DIAGNOSIS — I10 Essential (primary) hypertension: Secondary | ICD-10-CM | POA: Insufficient documentation

## 2023-10-27 DIAGNOSIS — Z01812 Encounter for preprocedural laboratory examination: Secondary | ICD-10-CM | POA: Insufficient documentation

## 2023-10-27 DIAGNOSIS — G4733 Obstructive sleep apnea (adult) (pediatric): Secondary | ICD-10-CM | POA: Diagnosis not present

## 2023-10-27 DIAGNOSIS — Z87891 Personal history of nicotine dependence: Secondary | ICD-10-CM | POA: Insufficient documentation

## 2023-10-27 DIAGNOSIS — E119 Type 2 diabetes mellitus without complications: Secondary | ICD-10-CM | POA: Diagnosis not present

## 2023-10-27 DIAGNOSIS — Z01818 Encounter for other preprocedural examination: Secondary | ICD-10-CM

## 2023-10-27 HISTORY — DX: Presence of cardiac pacemaker: Z95.0

## 2023-10-27 LAB — CBC
HCT: 40.4 % (ref 39.0–52.0)
Hemoglobin: 13.1 g/dL (ref 13.0–17.0)
MCH: 31.1 pg (ref 26.0–34.0)
MCHC: 32.4 g/dL (ref 30.0–36.0)
MCV: 96 fL (ref 80.0–100.0)
Platelets: 291 K/uL (ref 150–400)
RBC: 4.21 MIL/uL — ABNORMAL LOW (ref 4.22–5.81)
RDW: 14 % (ref 11.5–15.5)
WBC: 10.5 K/uL (ref 4.0–10.5)
nRBC: 0 % (ref 0.0–0.2)

## 2023-10-27 LAB — BASIC METABOLIC PANEL WITH GFR
Anion gap: 10 (ref 5–15)
BUN: 19 mg/dL (ref 8–23)
CO2: 27 mmol/L (ref 22–32)
Calcium: 9.5 mg/dL (ref 8.9–10.3)
Chloride: 105 mmol/L (ref 98–111)
Creatinine, Ser: 0.95 mg/dL (ref 0.61–1.24)
GFR, Estimated: >60 mL/min (ref >60)
Glucose, Bld: 98 mg/dL (ref 70–99)
Potassium: 3.6 mmol/L (ref 3.5–5.1)
Sodium: 142 mmol/L (ref 135–145)

## 2023-10-27 LAB — SURGICAL PCR SCREEN
MRSA, PCR: NEGATIVE
Staphylococcus aureus: NEGATIVE

## 2023-10-27 NOTE — Progress Notes (Signed)
 PCP - Dr. Greig moon Cardiologist - Dr. Soyla Lunger Camnitz LOV 07-07-23 w/follow up in 2 years  PPM/ICD - Abbott Dual Chamber -  Device Orders - received Rep Notified - Redell Small notified  Chest x-ray - N/A EKG - 07-07-23 Stress Test - Denies  ECHO - 07-06-21 Cardiac Cath - Denies  Sleep Study - Yes, positive CPAP - wears a cpap nightly  Fasting Blood Sugar - 85-95 Checks Blood Sugar: 1 time a month.  Per patient has not taken medication in many years as he lost 100 lbs   Last dose of GLP1 agonist-  Denies GLP1 instructions: n/a  Blood Thinner Instructions: Denies Aspirin Instructions: Denies  ERAS Protcol - NPO PRE-SURGERY Ensure or G2- n/a  COVID TEST- n/a   Anesthesia review: Yes, HTN, DM (no longer takes medications or labs), sleep apnea   Patient denies shortness of breath, fever, cough and chest pain at PAT appointment. Patient denies any respiratory issues at this time.    All instructions explained to the patient, with a verbal understanding of the material. Patient agrees to go over the instructions while at home for a better understanding. Patient also instructed to self quarantine after being tested for COVID-19. The opportunity to ask questions was provided.

## 2023-10-28 NOTE — Anesthesia Preprocedure Evaluation (Signed)
 Anesthesia Evaluation  Patient identified by MRN, date of birth, ID band Patient awake    Reviewed: Allergy & Precautions, NPO status , Patient's Chart, lab work & pertinent test results  History of Anesthesia Complications Negative for: history of anesthetic complications  Airway Mallampati: II       Dental no notable dental hx. (+) Dental Advisory Given   Pulmonary sleep apnea , former smoker   breath sounds clear to auscultation       Cardiovascular hypertension, +CHF  + dysrhythmias + pacemaker  Rhythm:Regular Rate:Normal     Neuro/Psych    GI/Hepatic   Endo/Other  diabetes    Renal/GU      Musculoskeletal   Abdominal   Peds  Hematology   Anesthesia Other Findings   Reproductive/Obstetrics                              Anesthesia Physical Anesthesia Plan  ASA: 3  Anesthesia Plan: General   Post-op Pain Management:    Induction: Intravenous  PONV Risk Score and Plan: 1  Airway Management Planned: Oral ETT  Additional Equipment:   Intra-op Plan:   Post-operative Plan: Extubation in OR  Informed Consent:      Dental advisory given  Plan Discussed with:   Anesthesia Plan Comments: (PAT note by Lynwood Hope, PA-C:  73 year old male follows with cardiology for history of HTN, OSA on CPAP, 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  Seen by Orren Fabry, PA-C on 10/24/2023 for preop evaluation.  Per note, Mr. Hartwell's perioperative risk of a major cardiac event is 0.4% according to the Revised Cardiac Risk Index (RCRI).  Therefore, he is at low risk for perioperative complications.   His functional capacity is good at 5.62 METs according to the Duke Activity Status Index (DASI). Recommendations: According to ACC/AHA guidelines, no further cardiovascular testing needed.  The patient may proceed to surgery at acceptable risk. The patient was advised that if he  develops new symptoms prior to surgery to contact our office to arrange for a follow-up visit, and he verbalized understanding.  Other pertinent history includes former smoker (quit 1998), diet-controlled DM2.  Preop labs reviewed, unremarkable.  EKG 07/07/23: AV dual paced rhythm.  Rate 63.  Perioperative prescription for implanted cardiac device programming per progress note 09/30/2023: Device Information:  Clinic EP Physician:Dr. Will Camnitz Device Type:Pacemaker Manufacturer and Phone #:St. Jude/Abbott: [phone] Pacemaker Dependent?:Yes Date of Last Device Check:07/21/2023 Normal Device Function?:Yes  Electrophysiologist's Recommendations:   Have magnet available.  Provide continuous ECG monitoring when magnet is used or reprogramming is to be performed.  Procedure will likely interfere with device function.  Device should be programmed:  Asynchronous pacing during procedure and returned to normal programming after procedure  TTE 06/29/2021: Conclusions: 1.  Undetermined rhythm. 2.  There is mild concentric LVH. 3.  There is normal global left ventricular contractility. 4.  Overall left ventricular systolic function is normal with an EF between 55 to 60%. 5.  Indeterminate diastolic function. 6.  The right ventricle is mildly enlarged. 7.  The right ventricular systolic function is normal. 8.  Left atrium is moderately dilated by volume. 9.  The right atrial size is normal. 10.  Mild mitral regurgitation is present.  )         Anesthesia Quick Evaluation

## 2023-10-28 NOTE — Progress Notes (Signed)
 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  Seen by Orren Fabry, PA-C on 10/24/2023 for preop evaluation.  Per note, Travis Crawford's perioperative risk of a major cardiac event is 0.4% according to the Revised Cardiac Risk Index (RCRI).  Therefore, he is at low risk for perioperative complications.   His functional capacity is good at 5.62 METs according to the Duke Activity Status Index (DASI). Recommendations: According to ACC/AHA guidelines, no further cardiovascular testing needed.  The patient may proceed to surgery at acceptable risk. The patient was advised that if he develops new symptoms prior to surgery to contact our office to arrange for a follow-up visit, and he verbalized understanding.  Other pertinent history includes former smoker (quit 1998), diet-controlled DM2.  Preop labs reviewed, unremarkable.  EKG 07/07/23: AV dual paced rhythm.  Rate 63.  Perioperative prescription for implanted cardiac device programming per progress note 09/30/2023: Device Information:   Clinic EP Physician:   Dr. Soyla Norton Device Type:  Pacemaker Manufacturer and Phone #:  St. Jude/Abbott: [phone] Pacemaker Dependent?:  Yes Date of Last Device Check:  07/21/2023         Normal Device Function?:  Yes     Electrophysiologist's Recommendations:   Have magnet available. Provide continuous ECG monitoring when magnet is used or reprogramming is to be performed.  Procedure will likely interfere with device function.  Device should be programmed:  Asynchronous pacing during procedure and returned to normal programming after procedure  TTE 06/29/2021: Conclusions: 1.  Undetermined rhythm. 2.  There is mild concentric LVH. 3.  There is normal global left ventricular contractility. 4.  Overall left ventricular systolic function is normal with an EF between 55 to 60%. 5.  Indeterminate  diastolic function. 6.  The right ventricle is mildly enlarged. 7.  The right ventricular systolic function is normal. 8.  Left atrium is moderately dilated by volume. 9.  The right atrial size is normal. 10.  Mild mitral regurgitation is present.    Lynwood Geofm RIGGERS [facility] Stay [facility]/Anesthesiology Phone [phone] 10/28/2023 9:29 AM

## 2023-10-29 ENCOUNTER — Ambulatory Visit (HOSPITAL_COMMUNITY)

## 2023-10-29 ENCOUNTER — Encounter (HOSPITAL_COMMUNITY): Payer: Self-pay | Admitting: Neurosurgery

## 2023-10-29 ENCOUNTER — Other Ambulatory Visit: Payer: Self-pay

## 2023-10-29 ENCOUNTER — Ambulatory Visit (HOSPITAL_COMMUNITY)
Admission: RE | Disposition: A | Discharge status: Home / Self Care | Payer: Self-pay | Source: Home / Self Care | Attending: Neurosurgery

## 2023-10-29 ENCOUNTER — Ambulatory Visit (HOSPITAL_BASED_OUTPATIENT_CLINIC_OR_DEPARTMENT_OTHER): Payer: Self-pay

## 2023-10-29 ENCOUNTER — Ambulatory Visit (HOSPITAL_COMMUNITY): Payer: Self-pay | Admitting: Physician Assistant

## 2023-10-29 ENCOUNTER — Ambulatory Visit (HOSPITAL_COMMUNITY)
Admission: RE | Admit: 2023-10-29 | Discharge: 2023-10-30 | Disposition: A | Discharge status: Home / Self Care | Attending: Neurosurgery | Admitting: Neurosurgery

## 2023-10-29 DIAGNOSIS — I110 Hypertensive heart disease with heart failure: Secondary | ICD-10-CM | POA: Insufficient documentation

## 2023-10-29 DIAGNOSIS — M4726 Other spondylosis with radiculopathy, lumbar region: Secondary | ICD-10-CM | POA: Insufficient documentation

## 2023-10-29 DIAGNOSIS — E119 Type 2 diabetes mellitus without complications: Secondary | ICD-10-CM | POA: Insufficient documentation

## 2023-10-29 DIAGNOSIS — M48062 Spinal stenosis, lumbar region with neurogenic claudication: Secondary | ICD-10-CM | POA: Diagnosis not present

## 2023-10-29 DIAGNOSIS — Z950 Presence of cardiac pacemaker: Secondary | ICD-10-CM | POA: Insufficient documentation

## 2023-10-29 DIAGNOSIS — Z87891 Personal history of nicotine dependence: Secondary | ICD-10-CM | POA: Insufficient documentation

## 2023-10-29 DIAGNOSIS — G4730 Sleep apnea, unspecified: Secondary | ICD-10-CM | POA: Diagnosis not present

## 2023-10-29 DIAGNOSIS — I509 Heart failure, unspecified: Secondary | ICD-10-CM | POA: Diagnosis not present

## 2023-10-29 DIAGNOSIS — I5030 Unspecified diastolic (congestive) heart failure: Secondary | ICD-10-CM | POA: Diagnosis not present

## 2023-10-29 DIAGNOSIS — Z79899 Other long term (current) drug therapy: Secondary | ICD-10-CM | POA: Insufficient documentation

## 2023-10-29 HISTORY — PX: LUMBAR LAMINECTOMY/DECOMPRESSION MICRODISCECTOMY: SHX5026

## 2023-10-29 LAB — GLUCOSE, CAPILLARY
Glucose-Capillary: 94 mg/dL (ref 70–99)
Glucose-Capillary: 143 mg/dL — ABNORMAL HIGH (ref 70–99)
Glucose-Capillary: 162 mg/dL — ABNORMAL HIGH (ref 70–99)
Glucose-Capillary: 169 mg/dL — ABNORMAL HIGH (ref 70–99)

## 2023-10-29 SURGERY — LUMBAR LAMINECTOMY/DECOMPRESSION MICRODISCECTOMY 3 LEVELS
Anesthesia: General | Laterality: Bilateral

## 2023-10-29 MED ORDER — ONDANSETRON HCL 4 MG/2ML IJ SOLN: 4.0000 mg | Freq: Four times a day (QID) | INTRAMUSCULAR | Status: DC | PRN

## 2023-10-29 MED ORDER — OXYCODONE HCL 5 MG PO TABS
5.0000 mg | ORAL_TABLET | Freq: Once | ORAL | Status: AC | PRN
  Administered 2023-10-29: 5 mg via ORAL

## 2023-10-29 MED ORDER — DEXAMETHASONE SODIUM PHOSPHATE 10 MG/ML IJ SOLN
INTRAMUSCULAR | Status: DC | PRN
  Administered 2023-10-29: 10 mg via INTRAVENOUS

## 2023-10-29 MED ORDER — OXYCODONE HCL 5 MG/5ML PO SOLN: 5.0000 mg | Freq: Once | ORAL | Status: AC | PRN

## 2023-10-29 MED ORDER — PHENOL 1.4 % MT LIQD: 1.0000 | OROMUCOSAL | Status: DC | PRN

## 2023-10-29 MED ORDER — ONDANSETRON HCL 4 MG/2ML IJ SOLN
INTRAMUSCULAR | Status: AC
  Filled 2023-10-29: qty 2

## 2023-10-29 MED ORDER — OXYCODONE HCL 5 MG PO TABS
ORAL_TABLET | ORAL | Status: AC
  Filled 2023-10-29: qty 1

## 2023-10-29 MED ORDER — MIDAZOLAM HCL 2 MG/2ML IJ SOLN
INTRAMUSCULAR | Status: DC | PRN
  Administered 2023-10-29: 1 mg via INTRAVENOUS

## 2023-10-29 MED ORDER — OXYCODONE-ACETAMINOPHEN 10-325 MG PO TABS: 1.0000 | ORAL_TABLET | ORAL | Status: DC | PRN

## 2023-10-29 MED ORDER — FENTANYL CITRATE (PF) 100 MCG/2ML IJ SOLN
INTRAMUSCULAR | Status: AC
  Filled 2023-10-29: qty 2

## 2023-10-29 MED ORDER — VANCOMYCIN HCL IN DEXTROSE 1-5 GM/200ML-% IV SOLN
1000.0000 mg | INTRAVENOUS | Status: AC
  Administered 2023-10-29: 1000 mg via INTRAVENOUS
  Filled 2023-10-29: qty 200

## 2023-10-29 MED ORDER — GABAPENTIN 300 MG PO CAPS
600.0000 mg | ORAL_CAPSULE | Freq: Three times a day (TID) | ORAL | Status: DC | PRN
  Administered 2023-10-29: 600 mg via ORAL
  Filled 2023-10-29: qty 2

## 2023-10-29 MED ORDER — 0.9 % SODIUM CHLORIDE (POUR BTL) OPTIME
TOPICAL | Status: DC | PRN
  Administered 2023-10-29: 1000 mL

## 2023-10-29 MED ORDER — SUCCINYLCHOLINE CHLORIDE 200 MG/10ML IV SOSY
PREFILLED_SYRINGE | INTRAVENOUS | Status: AC
  Filled 2023-10-29: qty 10

## 2023-10-29 MED ORDER — ORAL CARE MOUTH RINSE: 15.0000 mL | Freq: Once | OROMUCOSAL | Status: AC

## 2023-10-29 MED ORDER — CHLORHEXIDINE GLUCONATE CLOTH 2 % EX PADS
6.0000 | MEDICATED_PAD | Freq: Once | CUTANEOUS | Status: DC
Start: 2023-10-29 — End: 2023-10-29

## 2023-10-29 MED ORDER — OXYCODONE-ACETAMINOPHEN 5-325 MG PO TABS
1.0000 | ORAL_TABLET | ORAL | Status: DC | PRN
Start: 2023-10-29 — End: 2023-10-30
  Administered 2023-10-29 – 2023-10-30 (×4): 1 via ORAL
  Filled 2023-10-29 (×4): qty 1

## 2023-10-29 MED ORDER — ROCURONIUM BROMIDE 10 MG/ML (PF) SYRINGE
PREFILLED_SYRINGE | INTRAVENOUS | Status: DC | PRN
  Administered 2023-10-29: 10 mg via INTRAVENOUS
  Administered 2023-10-29: 70 mg via INTRAVENOUS

## 2023-10-29 MED ORDER — BISACODYL 10 MG RE SUPP
10.0000 mg | Freq: Every day | RECTAL | Status: DC | PRN
  Filled 2023-10-29: qty 1

## 2023-10-29 MED ORDER — FENTANYL CITRATE (PF) 250 MCG/5ML IJ SOLN
INTRAMUSCULAR | Status: AC
  Filled 2023-10-29: qty 5

## 2023-10-29 MED ORDER — FENTANYL CITRATE (PF) 250 MCG/5ML IJ SOLN
INTRAMUSCULAR | Status: DC | PRN
  Administered 2023-10-29: 100 ug via INTRAVENOUS

## 2023-10-29 MED ORDER — BUPIVACAINE LIPOSOME 1.3 % IJ SUSP
INTRAMUSCULAR | Status: DC | PRN
  Administered 2023-10-29: 20 mL

## 2023-10-29 MED ORDER — CHLORHEXIDINE GLUCONATE 0.12 % MT SOLN
15.0000 mL | Freq: Once | OROMUCOSAL | Status: AC
  Administered 2023-10-29: 15 mL via OROMUCOSAL
  Filled 2023-10-29: qty 15

## 2023-10-29 MED ORDER — LIDOCAINE 2% (20 MG/ML) 5 ML SYRINGE
INTRAMUSCULAR | Status: AC
  Filled 2023-10-29: qty 5

## 2023-10-29 MED ORDER — PHENYLEPHRINE 80 MCG/ML (10ML) SYRINGE FOR IV PUSH (FOR BLOOD PRESSURE SUPPORT)
PREFILLED_SYRINGE | INTRAVENOUS | Status: DC | PRN
  Administered 2023-10-29: 100 ug via INTRAVENOUS

## 2023-10-29 MED ORDER — PROPOFOL 10 MG/ML IV BOLUS
INTRAVENOUS | Status: DC | PRN
Start: 2023-10-29 — End: 2023-10-29
  Administered 2023-10-29: 160 mg via INTRAVENOUS

## 2023-10-29 MED ORDER — ONDANSETRON HCL 4 MG/2ML IJ SOLN: 4.0000 mg | Freq: Once | INTRAMUSCULAR | Status: DC | PRN

## 2023-10-29 MED ORDER — TAMSULOSIN HCL 0.4 MG PO CAPS
0.4000 mg | ORAL_CAPSULE | Freq: Every day | ORAL | Status: DC
  Administered 2023-10-29: 0.4 mg via ORAL
  Filled 2023-10-29: qty 1

## 2023-10-29 MED ORDER — CYCLOBENZAPRINE HCL 10 MG PO TABS
10.0000 mg | ORAL_TABLET | Freq: Three times a day (TID) | ORAL | Status: DC | PRN
  Administered 2023-10-29 (×2): 10 mg via ORAL
  Filled 2023-10-29 (×2): qty 1

## 2023-10-29 MED ORDER — PRAVASTATIN SODIUM 10 MG PO TABS
20.0000 mg | ORAL_TABLET | Freq: Every day | ORAL | Status: DC
Start: 2023-10-29 — End: 2023-10-30
  Administered 2023-10-29: 20 mg via ORAL
  Filled 2023-10-29: qty 2

## 2023-10-29 MED ORDER — MORPHINE SULFATE (PF) 2 MG/ML IV SOLN
2.0000 mg | INTRAVENOUS | Status: DC | PRN
  Administered 2023-10-29: 4 mg via INTRAVENOUS
  Filled 2023-10-29: qty 2

## 2023-10-29 MED ORDER — SODIUM CHLORIDE 0.9% FLUSH
3.0000 mL | Freq: Two times a day (BID) | INTRAVENOUS | Status: DC
Start: 2023-10-29 — End: 2023-10-30
  Administered 2023-10-29 (×2): 3 mL via INTRAVENOUS

## 2023-10-29 MED ORDER — ACETAMINOPHEN 650 MG RE SUPP: 650.0000 mg | RECTAL | Status: DC | PRN

## 2023-10-29 MED ORDER — LACTATED RINGERS IV SOLN
INTRAVENOUS | Status: DC
Start: 2023-10-29 — End: 2023-10-29

## 2023-10-29 MED ORDER — TORSEMIDE 20 MG PO TABS
20.0000 mg | ORAL_TABLET | Freq: Every day | ORAL | Status: DC
  Filled 2023-10-29: qty 1

## 2023-10-29 MED ORDER — PROPOFOL 10 MG/ML IV BOLUS
INTRAVENOUS | Status: AC
  Filled 2023-10-29: qty 20

## 2023-10-29 MED ORDER — ACETAMINOPHEN 500 MG PO TABS
1000.0000 mg | ORAL_TABLET | Freq: Four times a day (QID) | ORAL | Status: DC
Start: 2023-10-29 — End: 2023-10-30
  Administered 2023-10-29 – 2023-10-30 (×2): 1000 mg via ORAL
  Filled 2023-10-29 (×2): qty 2

## 2023-10-29 MED ORDER — SUGAMMADEX SODIUM 200 MG/2ML IV SOLN
INTRAVENOUS | Status: DC | PRN
  Administered 2023-10-29: 200 mg via INTRAVENOUS

## 2023-10-29 MED ORDER — BUPIVACAINE-EPINEPHRINE (PF) 0.5% -1:200000 IJ SOLN
INTRAMUSCULAR | Status: DC | PRN
  Administered 2023-10-29: 10 mL

## 2023-10-29 MED ORDER — BETHANECHOL CHLORIDE 10 MG PO TABS
10.0000 mg | ORAL_TABLET | Freq: Three times a day (TID) | ORAL | Status: DC
  Administered 2023-10-29 – 2023-10-30 (×3): 10 mg via ORAL
  Filled 2023-10-29 (×3): qty 1

## 2023-10-29 MED ORDER — ACETAMINOPHEN 10 MG/ML IV SOLN
INTRAVENOUS | Status: AC
Start: 2023-10-29 — End: 2023-10-29
  Filled 2023-10-29: qty 100

## 2023-10-29 MED ORDER — BACITRACIN ZINC 500 UNIT/GM EX OINT
TOPICAL_OINTMENT | CUTANEOUS | Status: AC
  Filled 2023-10-29: qty 28.35

## 2023-10-29 MED ORDER — THROMBIN 5000 UNITS EX KIT
PACK | CUTANEOUS | Status: AC
  Filled 2023-10-29: qty 3

## 2023-10-29 MED ORDER — POTASSIUM CHLORIDE CRYS ER 20 MEQ PO TBCR
20.0000 meq | EXTENDED_RELEASE_TABLET | Freq: Every day | ORAL | Status: DC
  Administered 2023-10-30: 20 meq via ORAL
  Filled 2023-10-29: qty 1

## 2023-10-29 MED ORDER — DROPERIDOL 2.5 MG/ML IJ SOLN: 0.6250 mg | Freq: Once | INTRAMUSCULAR | Status: DC | PRN

## 2023-10-29 MED ORDER — SODIUM CHLORIDE 0.9 % IV SOLN
250.0000 mL | INTRAVENOUS | Status: AC
  Administered 2023-10-29: 250 mL via INTRAVENOUS

## 2023-10-29 MED ORDER — ONDANSETRON HCL 4 MG PO TABS: 4.0000 mg | ORAL_TABLET | Freq: Four times a day (QID) | ORAL | Status: DC | PRN

## 2023-10-29 MED ORDER — THROMBIN 5000 UNITS EX SOLR
OROMUCOSAL | Status: DC | PRN
  Administered 2023-10-29: 5 mL via TOPICAL

## 2023-10-29 MED ORDER — MENTHOL 3 MG MT LOZG: 1.0000 | LOZENGE | OROMUCOSAL | Status: DC | PRN

## 2023-10-29 MED ORDER — MIDAZOLAM HCL 2 MG/2ML IJ SOLN
INTRAMUSCULAR | Status: AC
  Filled 2023-10-29: qty 2

## 2023-10-29 MED ORDER — BACITRACIN ZINC 500 UNIT/GM EX OINT
TOPICAL_OINTMENT | CUTANEOUS | Status: DC | PRN
  Administered 2023-10-29: 1 via TOPICAL

## 2023-10-29 MED ORDER — BUPIVACAINE-EPINEPHRINE (PF) 0.5% -1:200000 IJ SOLN
INTRAMUSCULAR | Status: AC
  Filled 2023-10-29: qty 30

## 2023-10-29 MED ORDER — FENTANYL CITRATE (PF) 100 MCG/2ML IJ SOLN
25.0000 ug | INTRAMUSCULAR | Status: DC | PRN
  Administered 2023-10-29 (×2): 50 ug via INTRAVENOUS

## 2023-10-29 MED ORDER — THROMBIN (RECOMBINANT) 5000 UNITS EX SOLR
CUTANEOUS | Status: DC | PRN
  Administered 2023-10-29: 10 mL via TOPICAL

## 2023-10-29 MED ORDER — OXYCODONE HCL 5 MG PO TABS
5.0000 mg | ORAL_TABLET | ORAL | Status: DC | PRN
Start: 2023-10-29 — End: 2023-10-30
  Administered 2023-10-29 – 2023-10-30 (×4): 5 mg via ORAL
  Filled 2023-10-29 (×4): qty 1

## 2023-10-29 MED ORDER — CHLORHEXIDINE GLUCONATE CLOTH 2 % EX PADS: 6.0000 | MEDICATED_PAD | Freq: Once | CUTANEOUS | Status: DC

## 2023-10-29 MED ORDER — ACETAMINOPHEN 10 MG/ML IV SOLN
1000.0000 mg | Freq: Once | INTRAVENOUS | Status: DC | PRN
  Administered 2023-10-29: 1000 mg via INTRAVENOUS

## 2023-10-29 MED ORDER — SODIUM CHLORIDE 0.9% FLUSH: 3.0000 mL | INTRAVENOUS | Status: DC | PRN

## 2023-10-29 MED ORDER — BUPIVACAINE LIPOSOME 1.3 % IJ SUSP
INTRAMUSCULAR | Status: AC
  Filled 2023-10-29: qty 20

## 2023-10-29 MED ORDER — DOCUSATE SODIUM 100 MG PO CAPS
100.0000 mg | ORAL_CAPSULE | Freq: Two times a day (BID) | ORAL | Status: DC
  Administered 2023-10-29 – 2023-10-30 (×2): 100 mg via ORAL
  Filled 2023-10-29 (×2): qty 1

## 2023-10-29 MED ORDER — INSULIN ASPART 100 UNIT/ML IJ SOLN: 0.0000 [IU] | INTRAMUSCULAR | Status: DC | PRN

## 2023-10-29 MED ORDER — LIDOCAINE 2% (20 MG/ML) 5 ML SYRINGE
INTRAMUSCULAR | Status: DC | PRN
  Administered 2023-10-29: 100 mg via INTRAVENOUS

## 2023-10-29 MED ORDER — ROCURONIUM BROMIDE 10 MG/ML (PF) SYRINGE
PREFILLED_SYRINGE | INTRAVENOUS | Status: AC
  Filled 2023-10-29: qty 10

## 2023-10-29 MED ORDER — DEXAMETHASONE SODIUM PHOSPHATE 10 MG/ML IJ SOLN
INTRAMUSCULAR | Status: AC
  Filled 2023-10-29: qty 1

## 2023-10-29 MED ORDER — ACETAMINOPHEN 325 MG PO TABS: 650.0000 mg | ORAL_TABLET | ORAL | Status: DC | PRN

## 2023-10-29 MED ORDER — VANCOMYCIN HCL IN DEXTROSE 1-5 GM/200ML-% IV SOLN
1000.0000 mg | Freq: Once | INTRAVENOUS | Status: AC
  Filled 2023-10-29: qty 200

## 2023-10-29 SURGICAL SUPPLY — 46 items
BAG COUNTER SPONGE SURGICOUNT (BAG) ×1 IMPLANT
BAND RUBBER #18 3X1/16 STRL (MISCELLANEOUS) ×2 IMPLANT
BENZOIN TINCTURE PRP APPL 2/3 (GAUZE/BANDAGES/DRESSINGS) ×1 IMPLANT
BLADE CLIPPER SURG (BLADE) IMPLANT
BUR MATCHSTICK NEURO 3.0 LAGG (BURR) ×1 IMPLANT
BUR PRECISION FLUTE 6.0 (BURR) ×1 IMPLANT
CANISTER SUCTION 3000ML PPV (SUCTIONS) ×1 IMPLANT
DRAPE LAPAROTOMY 100X72X124 (DRAPES) ×1 IMPLANT
DRAPE MICROSCOPE SLANT 54X150 (MISCELLANEOUS) ×1 IMPLANT
DRAPE SURG 17X23 STRL (DRAPES) ×4 IMPLANT
DRSG OPSITE POSTOP 4X6 (GAUZE/BANDAGES/DRESSINGS) ×1 IMPLANT
DRSG TEGADERM 4X4.75 (GAUZE/BANDAGES/DRESSINGS) IMPLANT
ELECTRODE BLDE 4.0 EZ CLN MEGD (MISCELLANEOUS) ×1 IMPLANT
ELECTRODE REM PT RTRN 9FT ADLT (ELECTROSURGICAL) ×1 IMPLANT
EVACUATOR 1/8 PVC DRAIN (DRAIN) IMPLANT
GAUZE 4X4 16PLY ~~LOC~~+RFID DBL (SPONGE) IMPLANT
GAUZE SPONGE 4X4 12PLY STRL (GAUZE/BANDAGES/DRESSINGS) ×1 IMPLANT
GLOVE BIO SURGEON STRL SZ 6 (GLOVE) ×1 IMPLANT
GLOVE BIO SURGEON STRL SZ8 (GLOVE) ×1 IMPLANT
GLOVE BIO SURGEON STRL SZ8.5 (GLOVE) ×1 IMPLANT
GLOVE BIOGEL PI IND STRL 6.5 (GLOVE) ×1 IMPLANT
GLOVE EXAM NITRILE XL STR (GLOVE) IMPLANT
GOWN STRL REUS W/ TWL LRG LVL3 (GOWN DISPOSABLE) ×1 IMPLANT
GOWN STRL REUS W/ TWL XL LVL3 (GOWN DISPOSABLE) ×1 IMPLANT
GOWN STRL REUS W/TWL 2XL LVL3 (GOWN DISPOSABLE) IMPLANT
HEMOSTAT POWDER KIT SURGIFOAM (HEMOSTASIS) ×1 IMPLANT
KIT BASIN OR (CUSTOM PROCEDURE TRAY) ×1 IMPLANT
KIT TURNOVER KIT B (KITS) ×1 IMPLANT
NDL HYPO 21X1.5 SAFETY (NEEDLE) IMPLANT
NDL HYPO 22X1.5 SAFETY MO (MISCELLANEOUS) ×1 IMPLANT
NEEDLE HYPO 21X1.5 SAFETY (NEEDLE) ×1 IMPLANT
NEEDLE HYPO 22X1.5 SAFETY MO (MISCELLANEOUS) ×1 IMPLANT
PACK LAMINECTOMY NEURO (CUSTOM PROCEDURE TRAY) ×1 IMPLANT
PAD ARMBOARD POSITIONER FOAM (MISCELLANEOUS) ×3 IMPLANT
PATTIES SURGICAL .5 X1 (DISPOSABLE) IMPLANT
SOLN 0.9% NACL 1000 ML (IV SOLUTION) ×1 IMPLANT
SOLN 0.9% NACL POUR BTL 1000ML (IV SOLUTION) ×1 IMPLANT
SOLN STERILE WATER 1000 ML (IV SOLUTION) ×1 IMPLANT
SOLN STERILE WATER BTL 1000 ML (IV SOLUTION) ×1 IMPLANT
SPONGE SURGIFOAM ABS GEL SZ50 (HEMOSTASIS) ×1 IMPLANT
STRIP CLOSURE SKIN 1/2X4 (GAUZE/BANDAGES/DRESSINGS) ×1 IMPLANT
SUT VIC AB 1 CT1 18XBRD ANBCTR (SUTURE) ×2 IMPLANT
SUT VIC AB 2-0 CP2 18 (SUTURE) ×2 IMPLANT
SYR 30ML LL (SYRINGE) IMPLANT
TOWEL GREEN STERILE (TOWEL DISPOSABLE) ×1 IMPLANT
TOWEL GREEN STERILE FF (TOWEL DISPOSABLE) ×1 IMPLANT

## 2023-10-29 NOTE — Anesthesia Postprocedure Evaluation (Signed)
 Anesthesia Post Note  Patient: Travis Crawford  Procedure(s) Performed: BILATERAL LUMBAR TWO-THREE, LUMBAR THREE-FOUR, LUMBAR FOUR-FIVE LUMBAR LAMINECTOMY/ FORAMINOTOMIES (Bilateral)     Patient location during evaluation: PACU Anesthesia Type: General Level of consciousness: patient cooperative and awake Pain management: pain level controlled Vital Signs Assessment: post-procedure vital signs reviewed and stable Respiratory status: spontaneous breathing and nonlabored ventilation Cardiovascular status: blood pressure returned to baseline Postop Assessment: no apparent nausea or vomiting Anesthetic complications: no                Lauraine KATHEE [place]

## 2023-10-29 NOTE — H&P (Signed)
 Subjective: The patient is a 73 year old white male who has complained of back and bilateral leg pain consistent with neurogenic claudication.  He has failed medical management and was worked up with a lumbar MRI which demonstrated multilevel spinal stenosis.  I discussed the various treatment options with the patient.  He has decided proceed with surgery.  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   History of kidney stones    one time   Hypertension    Lumbar spondylosis 12/24/2017   Pneumonia    Presence of permanent cardiac pacemaker    not dependent on pacemaker   Right hip pain 12/24/2017   RUQ pain 08/17/2021   Sciatica of right side 12/24/2017   Sleep apnea    wears cpap   Spinal stenosis 04/17/2016    Past Surgical History:  Procedure Laterality Date   APPENDECTOMY     CYST REMOVAL TRUNK     breast on left   HEMORRHOID SURGERY     HERNIA REPAIR     umbilical hernia   LUMBAR LAMINECTOMY/DECOMPRESSION MICRODISCECTOMY N/A 04/17/2016   Procedure: LUMBAR LAMINECTOMY/DECOMPRESSION MICRODISCECTOMY 2 LEVELS;  Surgeon: Donaciano Sprang, MD;  Location: [facility] OR;  Service: Orthopedics;  Laterality: N/A;   PACEMAKER IMPLANT N/A 01/17/2022   Procedure: PACEMAKER IMPLANT;  Surgeon: Inocencio Soyla Lunger, MD;  Location: [facility] INVASIVE CV LAB;  Service: Cardiovascular;  Laterality: N/A;    Allergies  Allergen Reactions   Rocephin  [Ceftriaxone ] Itching    Bilateral lower extremity    Social History   Tobacco Use   Smoking status: Former    Current packs/day: 0.00    Types: Cigarettes    Quit date: 04/10/1996    Years since quitting: 27.5   Smokeless tobacco: Former    Types: Chew  Substance Use Topics   Alcohol  use: Not Currently    Family History  Problem Relation Age of Onset   Cancer Mother    Prior  to Admission medications   Medication Sig Start Date End Date Taking? Authorizing Provider  acetaminophen  (TYLENOL ) 500 MG tablet Take 500-1,000 mg by mouth every 6 (six) hours as needed (pain.).   Yes [provider]  bethanechol  (URECHOLINE ) 10 MG tablet Take 10 mg by mouth 3 (three) times daily. 11/13/21  Yes [provider]  gabapentin  (NEURONTIN ) 600 MG tablet Take 600 mg by mouth 3 (three) times daily as needed (pain.). 09/09/23  Yes [provider]  lovastatin (MEVACOR) 20 MG tablet Take 20 mg by mouth at bedtime.   Yes [provider]  oxyCODONE -acetaminophen  (PERCOCET) 10-325 MG tablet Take 1 tablet by mouth 2 (two) times daily as needed for pain.   Yes [provider]  Polyvinyl Alcohol -Povidone (CLEAR EYES NATURAL TEARS OP) Place 1 drop into both eyes 3 (three) times daily as needed (dry/irritated eyes.).   Yes [provider]  Potassium Chloride  ER 20 MEQ TBCR Take 20 mEq by mouth in the morning. 09/05/23  Yes [provider]  tamsulosin  (FLOMAX ) 0.4 MG CAPS capsule Take 0.4 mg by mouth daily after supper. 06/08/23  Yes [provider]  torsemide  (DEMADEX ) 20 MG tablet Take 20 mg by mouth in the morning.   Yes [provider]  traZODone (DESYREL) 50 MG tablet Take 50-100 mg by mouth at bedtime. 04/17/23  Yes  [provider]     Review of Systems  Positive ROS: As above  All other systems have been reviewed and were otherwise negative with the exception of those mentioned in the HPI and as above.  Objective: Vital signs in last 24 hours: Temp:  [97.6 F (36.4 C)] 97.6 F (36.4 C) (09/24 0557) Pulse Rate:  [59] 59 (09/24 0557) Resp:  [20] 20 (09/24 0557) BP: (154)/(69) 154/69 (09/24 0557) SpO2:  [100 %] 100 % (09/24 0557) Weight:  [83.9 kg] 83.9 kg (09/24 0557) Estimated body mass index is 28.98 kg/m as calculated from the following:   Height as of this encounter: 5' 7 (1.702 m).   Weight as  of this encounter: 83.9 kg.   General Appearance: Alert Head: Normocephalic, without obvious abnormality, atraumatic Eyes: PERRL, conjunctiva/corneas clear, EOM's intact,    Ears: Normal  Throat: Normal  Neck: Supple, Back: unremarkable Lungs: Clear to auscultation bilaterally, respirations unlabored Heart: Regular rate and rhythm, no murmur, rub or gallop Abdomen: Soft, non-tender Extremities: Extremities normal, atraumatic, no cyanosis or edema Skin: unremarkable  NEUROLOGIC:   Mental status: alert and oriented,Motor Exam - grossly normal Sensory Exam - grossly normal Reflexes:  Coordination - grossly normal Gait - grossly normal Balance - grossly normal Cranial Nerves: I: smell Not tested  II: visual acuity  OS: Normal  OD: Normal   II: visual fields Full to confrontation  II: pupils Equal, round, reactive to light  III,VII: ptosis None  III,IV,VI: extraocular muscles  Full ROM  V: mastication Normal  V: facial light touch sensation  Normal  V,VII: corneal reflex  Present  VII: facial muscle function - upper  Normal  VII: facial muscle function - lower Normal  VIII: hearing Not tested  IX: soft palate elevation  Normal  IX,X: gag reflex Present  XI: trapezius strength  5/5  XI: sternocleidomastoid strength 5/5  XI: neck flexion strength  5/5  XII: tongue strength  Normal    Data Review Lab Results  Component Value Date   WBC 10.5 10/27/2023   HGB 13.1 10/27/2023   HCT 40.4 10/27/2023   MCV 96.0 10/27/2023   PLT 291 10/27/2023   Lab Results  Component Value Date   NA 142 10/27/2023   K 3.6 10/27/2023   CL 105 10/27/2023   CO2 27 10/27/2023   BUN 19 10/27/2023   CREATININE 0.95 10/27/2023   GLUCOSE 98 10/27/2023   No results found for: INR, PROTIME  Assessment/Plan: Lumbar spinal stenosis, neurogenic claudication, lumbago, lumbar radiculopathy: I discussed situation with the patient.  I reviewed his MRI scan with him.  I pointed out the  abnormalities.  We have discussed the various treatment options including surgery.  I have described the surgical treatment option of bilateral L2-3, L3-4 and L4 laminotomy/foraminotomies.  I have shown him surgical models.  I have given him a surgical pamphlet.  We have discussed the risk, benefits, alternatives, expected postop course, and likelihood of achieving our goals with surgery.  I have answered all the patient's questions.  He has decided proceed with surgery.   Reyes JONETTA Budge 10/29/2023 7:23 AM

## 2023-10-29 NOTE — Op Note (Signed)
 Brief history: The patient is a 73 year old white male who has had previous back surgery by another physician.  He has had chronic back and leg pain.  He has failed medical management.  He was worked up with a lumbar MRI which demonstrated multilevel degenerative changes with stenosis most prominent at L2-3, L3-4 and L4-5.  I discussed the various treatment options with the patient.  He has decided proceed with surgery.  Preoperative diagnosis: Lumbar spondylosis, lumbar spinal stenosis, neurogenic claudication, lumbago  Postoperative diagnosis: The same  Procedure: Redo bilateral L2-3, L3-4 and L4-5 laminotomy/foraminotomies   Surgeon: Dr. Chyrl Budge  Asst.: Duwaine Beck, NP  Anesthesia: Gen. endotracheal  Estimated blood loss: 100 cc  Drains: Medium Hemovac drain in the epidural space.   Complications: None  Description of procedure: The patient was brought to the operating room by the anesthesia team. General endotracheal anesthesia was induced. The patient was turned to the prone position on the Wilson frame. The patient's lumbosacral region was then prepared with Betadine scrub and Betadine solution. Sterile drapes were applied.  I then injected the area to be incised with Marcaine  with epinephrine  solution. I then used a scalpel to make a linear midline incision over the L2-3, L3-4 and L4-5 intervertebral disc space. I then used electrocautery to perform a left sided subperiosteal dissection exposing the spinous process and lamina of L2-3, L3-4 and L4-5. We obtained intraoperative radiograph to confirm our location. I then inserted the [facility] retractor for exposure.  I used a high-speed drill to perform a laminotomy at L2-3, L3-4 and L4-5 on the left. I then used a Kerrison punches to widen the laminotomy and removed the ligamentum flavum at L2-3, L3-4 and L4-5 on the left.  We then freed up the thecal sac and the left L3, L4 and L5 nerve root from the epidural tissue.  Of note  we encountered epidural scar from the previous operation, tissue particularly at L4-5.  I then used a Kerrison punch to perform a foraminotomy at about the left L3, L4 and L5 nerve root.  I then used a high-speed drill to drill across the midline at L2-3, L3-4 and L4-5 performing a right L2-3, L3-4 and L4-5 laminotomy from the left side.  I then used a Kerrison punch to remove the right L2-3, L3-4 and L4-5 ligamentum flavum and to perform foraminotomies at about the right L3, L4 and L5 nerve root completing the decompression.  We then obtained hemostasis using bipolar electrocautery. We irrigated the wound out with saline solution. We then removed the retractor.  We injected Exparel  rel.  We placed a medium Hemovac drain in the epidural space and tunneled it out through a separate stab wound.  We then reapproximated the patient's thoracolumbar fascia with interrupted #1 Vicryl suture. We then reapproximated the patient's subcutaneous tissue with interrupted 2-0 Vicryl suture. We then reapproximated patient's skin with Steri-Strips and benzoin. The was then coated with bacitracin  ointment. The drapes were removed. The patient was subsequently returned to the supine position where they were extubated by the anesthesia team. The patient was then transported to the postanesthesia care unit in stable condition. All sponge instrument and needle counts were reportedly correct at the end of this case.

## 2023-10-29 NOTE — Anesthesia Procedure Notes (Signed)
 Procedure Name: Intubation Date/Time: 10/29/2023 7:36 AM  Performed by: Worth Catherene Flores, CRNAPre-anesthesia Checklist: Patient identified, Emergency Drugs available, Suction available, Patient being monitored and Timeout performed Patient Re-evaluated:Patient Re-evaluated prior to induction Oxygen  Delivery Method: Circle system utilized Preoxygenation: Pre-oxygenation with 100% oxygen  Induction Type: IV induction Ventilation: Oral airway inserted - appropriate to patient size Laryngoscope Size: Mac and 4 Grade View: Grade I Tube type: Oral Tube size: 7.5 mm Number of attempts: 1 Airway Equipment and Method: Stylet and Oral airway Placement Confirmation: ETT inserted through vocal cords under direct vision, positive ETCO2 and breath sounds checked- equal and bilateral Secured at: 25 cm Tube secured with: Tape Dental Injury: Teeth and Oropharynx as per pre-operative assessment and Injury to lip  Comments: Small nick to upper lip. Minimal bleeding and it resolved without any interventions.

## 2023-10-29 NOTE — Transfer of Care (Signed)
 Immediate Anesthesia Transfer of Care Note  Patient: Travis Crawford  Procedure(s) Performed: BILATERAL LUMBAR TWO-THREE, LUMBAR THREE-FOUR, LUMBAR FOUR-FIVE LUMBAR LAMINECTOMY/ FORAMINOTOMIES (Bilateral)  Patient Location: PACU  Anesthesia Type:General  Level of Consciousness: awake and alert   Airway & Oxygen  Therapy: Patient Spontanous Breathing and Patient connected to face mask oxygen   Post-op Assessment: Report given to RN, Post -op Vital signs reviewed and stable, and Patient moving all extremities X 4  Post vital signs: Reviewed and stable  Last Vitals:  Vitals Value Taken Time  BP 165/72 10/29/23 10:20  Temp 36.4 C 10/29/23 10:20  Pulse 65 10/29/23 10:29  Resp 17 10/29/23 10:29  SpO2 95 % 10/29/23 10:29  Vitals shown include unfiled device data.  Last Pain:  Vitals:   10/29/23 1020  TempSrc:   PainSc: 0-No pain      Patients Stated Pain Goal: 1 (10/29/23 0604)  Complications: No notable events documented.

## 2023-10-29 NOTE — Plan of Care (Signed)

## 2023-10-29 NOTE — Anesthesia Procedure Notes (Addendum)
 Arterial Line Insertion Start/End9/24/2025 7:45 AM, 10/29/2023 7:49 AM Performed by: Waddell Lauraine NOVAK, MD, Worth Catherene Flores, CRNA, anesthesiologist  Patient location: OR. Preanesthetic checklist: patient identified, IV checked, site marked, risks and benefits discussed, surgical consent, monitors and equipment checked, pre-op evaluation, timeout performed and anesthesia consent Lidocaine  1% used for infiltration Left, radial was placed Catheter size: 20 G Hand hygiene performed  and maximum sterile barriers used   Attempts: 2 Procedure performed using ultrasound guided technique. Following insertion, dressing applied. Post procedure assessment: normal and unchanged

## 2023-10-30 ENCOUNTER — Encounter (HOSPITAL_COMMUNITY): Payer: Self-pay | Admitting: Neurosurgery

## 2023-10-30 DIAGNOSIS — M48062 Spinal stenosis, lumbar region with neurogenic claudication: Secondary | ICD-10-CM | POA: Diagnosis not present

## 2023-10-30 LAB — GLUCOSE, CAPILLARY: Glucose-Capillary: 115 mg/dL — ABNORMAL HIGH (ref 70–99)

## 2023-10-30 MED ORDER — OXYCODONE-ACETAMINOPHEN 5-325 MG PO TABS
1.0000 | ORAL_TABLET | ORAL | Status: DC | PRN
  Filled 2023-10-30: qty 1

## 2023-10-30 MED ORDER — CYCLOBENZAPRINE HCL 5 MG PO TABS: 5.0000 mg | ORAL_TABLET | Freq: Three times a day (TID) | ORAL | 0 refills | Status: AC | PRN

## 2023-10-30 MED ORDER — GABAPENTIN 600 MG PO TABS: 600.0000 mg | ORAL_TABLET | Freq: Three times a day (TID) | ORAL | 1 refills | Status: AC | PRN

## 2023-10-30 MED ORDER — DOCUSATE SODIUM 100 MG PO CAPS: 100.0000 mg | ORAL_CAPSULE | Freq: Two times a day (BID) | ORAL | 0 refills | Status: AC

## 2023-10-30 MED ORDER — OXYCODONE-ACETAMINOPHEN 10-325 MG PO TABS: 1.0000 | ORAL_TABLET | Freq: Two times a day (BID) | ORAL | 0 refills | Status: AC | PRN

## 2023-10-30 MED ORDER — CYCLOBENZAPRINE HCL 5 MG PO TABS
5.0000 mg | ORAL_TABLET | Freq: Three times a day (TID) | ORAL | Status: DC | PRN
Start: 2023-10-30 — End: 2023-10-30

## 2023-10-30 MED FILL — Thrombin For Soln 5000 Unit: CUTANEOUS | Qty: 2 | Status: AC

## 2023-10-30 NOTE — Discharge Summary (Signed)
 Physician Discharge Summary  Patient ID: Travis Crawford MRN: [id number] DOB/AGE: [birth year] 75 y.o.  Admit date: 10/29/2023 Discharge date: 10/30/2023  Admission Diagnoses: Lumbago, lumbar spondylosis, lumbar facet arthropathy, lumbar spinal stenosis, neurogenic claudication, lumbar radiculopathy  Discharge Diagnoses: The same Principal Problem:   Spinal stenosis of lumbar region with neurogenic claudication   Discharged Condition: good  Hospital Course: I performed bilateral L2-3, L3-4 and L4-5 laminotomies/foraminotomies on the patient on 10/29/2023.  The surgery went well.  The patient's postoperative course was unremarkable.  On postoperative day #1 the patient requested discharge to home.  He was given verbal and written discharge instructions.  All his questions were answered.  Consults: PT, care management Significant Diagnostic Studies: None Treatments: Bilateral L2-3, L3-4 and L4-5 laminotomy/foraminotomies Discharge Exam: Blood pressure (!) 132/53, pulse 67, temperature 97.9 F (36.6 C), temperature source Oral, resp. rate 18, height 5' 7 (1.702 m), weight 83.9 kg, SpO2 100%. The patient is alert and pleasant.  His strength is normal.  His dressing has a small blood stain.  Disposition: Home   Allergies as of 10/30/2023       Reactions   Rocephin  [ceftriaxone ] Itching   Bilateral lower extremity        Medication List     STOP taking these medications    acetaminophen  500 MG tablet Commonly known as: TYLENOL        TAKE these medications    bethanechol  10 MG tablet Commonly known as: URECHOLINE  Take 10 mg by mouth 3 (three) times daily.   CLEAR EYES NATURAL TEARS OP Place 1 drop into both eyes 3 (three) times daily as needed (dry/irritated eyes.).   cyclobenzaprine  5 MG tablet Commonly known as: FLEXERIL  Take 1 tablet (5 mg total) by mouth 3 (three) times daily as needed for muscle spasms.   docusate sodium  100 MG capsule Commonly known  as: COLACE Take 1 capsule (100 mg total) by mouth 2 (two) times daily.   gabapentin  600 MG tablet Commonly known as: NEURONTIN  Take 600 mg by mouth 3 (three) times daily as needed (pain.).   lovastatin 20 MG tablet Commonly known as: MEVACOR Take 20 mg by mouth at bedtime.   oxyCODONE -acetaminophen  10-325 MG tablet Commonly known as: PERCOCET Take 1 tablet by mouth 2 (two) times daily as needed for pain.   Potassium Chloride  ER 20 MEQ Tbcr Take 20 mEq by mouth in the morning.   tamsulosin  0.4 MG Caps capsule Commonly known as: FLOMAX  Take 0.4 mg by mouth daily after supper.   torsemide  20 MG tablet Commonly known as: DEMADEX  Take 20 mg by mouth in the morning.   traZODone 50 MG tablet Commonly known as: DESYREL Take 50-100 mg by mouth at bedtime.         Signed: Reyes JONETTA Budge 10/30/2023, 8:15 AM

## 2023-10-30 NOTE — Progress Notes (Signed)
 Patient alert and oriented, voided, ambulated. Surgical site clean and dry no sign of infection.d/c instructions explain and given to the patient all questions answered.

## 2023-10-30 NOTE — Evaluation (Signed)
 Physical Therapy Evaluation  Patient Details Name: Travis Crawford MRN: [id number] DOB: Jan 13, 1951 Today's Date: 10/30/2023  History of Present Illness  Pt is a 73 y/o male who presents s/p L2-L5 laminectomy/decompression on 10/29/2023. PMH significant for DM II, eosinophilic leukocytosis, HTN, pacemaker 2023, prior lami in 2018.  Clinical Impression  Pt admitted with above diagnosis. At the time of PT eval, pt was able to demonstrate transfers and ambulation with gross mod I to CGA and RW for support. Pt was educated on precautions, positioning recommendations, appropriate activity progression, and car transfer. Pt currently with functional limitations due to the deficits listed below (see PT Problem List). Pt will benefit from skilled PT to increase their independence and safety with mobility to allow discharge to the venue listed below.          If plan is discharge home, recommend the following: A little help with walking and/or transfers;A little help with bathing/dressing/bathroom;Assistance with cooking/housework;Help with stairs or ramp for entrance;Assist for transportation   Can travel by private vehicle        Equipment Recommendations None recommended by PT  Recommendations for Other Services       Functional Status Assessment Patient has had a recent decline in their functional status and demonstrates the ability to make significant improvements in function in a reasonable and predictable amount of time.     Precautions / Restrictions Precautions Precautions: Fall;Back Precaution Booklet Issued: Yes (comment) Recall of Precautions/Restrictions: Intact Precaution/Restrictions Comments: Reviewed handout and pt was cued for precautions during functional mobility. Restrictions Weight Bearing Restrictions Per Provider Order: No      Mobility  Bed Mobility Overal bed mobility: Modified Independent             General bed mobility comments: Increased time but  no assist required to transition to EOB. VC's for optimal log roll technique. HOB flat and rails lowered to simulate home environment.    Transfers Overall transfer level: Needs assistance Equipment used: Rolling walker (2 wheels) Transfers: Sit to/from Stand Sit to Stand: Supervision           General transfer comment: VC's for hand placement on seated surface for safety. No assist required. Close supervision for safety.    Ambulation/Gait Ambulation/Gait assistance: Contact guard assist Gait Distance (Feet): 175 Feet Assistive device: Rolling walker (2 wheels) Gait Pattern/deviations: Step-through pattern, Decreased stride length, Trunk flexed Gait velocity: Decreased Gait velocity interpretation: 1.31 - 2.62 ft/sec, indicative of limited community ambulator   General Gait Details: VC's for improved posture, closer walker proximity and forward gaze. No assist required. No gross unsteadiness noted.  Stairs Stairs:  (Deferred as pt has ramp entrance.)          Wheelchair Mobility     Tilt Bed    Modified Rankin (Stroke Patients Only)       Balance Overall balance assessment: Needs assistance Sitting-balance support: Feet supported, No upper extremity supported Sitting balance-Leahy Scale: Fair     Standing balance support: Bilateral upper extremity supported, During functional activity, Reliant on assistive device for balance Standing balance-Leahy Scale: Poor                               Pertinent Vitals/Pain Pain Assessment Pain Assessment: Faces Faces Pain Scale: Hurts little more Pain Location: back Pain Descriptors / Indicators: Operative site guarding, Sore Pain Intervention(s): Limited activity within patient's tolerance, Monitored during session, Repositioned    Home Living  Family/patient expects to be discharged to:: Private residence Living Arrangements: Spouse/significant other Available Help at Discharge: Family;Available 24  hours/day Type of Home: Mobile home Home Access: Ramped entrance       Home Layout: One level Home Equipment: Agricultural consultant (2 wheels);Cane - single point;BSC/3in1;Shower seat      Prior Function Prior Level of Function : Independent/Modified Independent             Mobility Comments: Uses cane       Extremity/Trunk Assessment   Upper Extremity Assessment Upper Extremity Assessment: Defer to OT evaluation    Lower Extremity Assessment Lower Extremity Assessment: Generalized weakness (Mild; consistent with pre-op diagnosis)    Cervical / Trunk Assessment Cervical / Trunk Assessment: Back Surgery  Communication   Communication Communication: No apparent difficulties    Cognition Arousal: Alert Behavior During Therapy: WFL for tasks assessed/performed   PT - Cognitive impairments: No apparent impairments                         Following commands: Intact       Cueing Cueing Techniques: Verbal cues, Gestural cues     General Comments      Exercises     Assessment/Plan    PT Assessment Patient needs continued PT services  PT Problem List Decreased strength;Decreased activity tolerance;Decreased balance;Decreased mobility;Decreased knowledge of use of DME;Decreased safety awareness;Decreased knowledge of precautions;Pain       PT Treatment Interventions DME instruction;Gait training;Stair training;Functional mobility training;Therapeutic activities;Therapeutic exercise;Balance training;Patient/family education    PT Goals (Current goals can be found in the Care Plan section)  Acute Rehab PT Goals Patient Stated Goal: Home today, be able to drive PT Goal Formulation: With patient Time For Goal Achievement: 11/06/23 Potential to Achieve Goals: Good    Frequency Min 5X/week     Co-evaluation               AM-PAC PT 6 Clicks Mobility  Outcome Measure Help needed turning from your back to your side while in a flat bed without  using bedrails?: None Help needed moving from lying on your back to sitting on the side of a flat bed without using bedrails?: None Help needed moving to and from a bed to a chair (including a wheelchair)?: A Little Help needed standing up from a chair using your arms (e.g., wheelchair or bedside chair)?: A Little Help needed to walk in hospital room?: A Little Help needed climbing 3-5 steps with a railing? : A Little 6 Click Score: 20    End of Session Equipment Utilized During Treatment: Gait belt Activity Tolerance: Patient tolerated treatment well Patient left: with call bell/phone within reach;in chair Nurse Communication: Mobility status PT Visit Diagnosis: Unsteadiness on feet (R26.81);Pain Pain - part of body:  (back)    Time: 9141-9074 PT Time Calculation (min) (ACUTE ONLY): 27 min   Charges:   PT Evaluation $PT Eval Low Complexity: 1 Low PT Treatments $Gait Training: 8-22 mins PT General Charges $$ ACUTE PT VISIT: 1 Visit         Leita Sable, PT, DPT Acute Rehabilitation Services Secure Chat Preferred Office: [phone]   Leita JONETTA Sable 10/30/2023, 9:44 AM

## 2023-11-08 DIAGNOSIS — N320 Bladder-neck obstruction: Secondary | ICD-10-CM | POA: Diagnosis not present

## 2023-11-08 DIAGNOSIS — N138 Other obstructive and reflux uropathy: Secondary | ICD-10-CM | POA: Diagnosis not present

## 2023-11-08 DIAGNOSIS — N3001 Acute cystitis with hematuria: Secondary | ICD-10-CM | POA: Diagnosis not present

## 2023-11-08 DIAGNOSIS — I10 Essential (primary) hypertension: Secondary | ICD-10-CM | POA: Diagnosis not present

## 2023-11-08 DIAGNOSIS — E119 Type 2 diabetes mellitus without complications: Secondary | ICD-10-CM | POA: Diagnosis not present

## 2023-11-08 DIAGNOSIS — N401 Enlarged prostate with lower urinary tract symptoms: Secondary | ICD-10-CM | POA: Diagnosis not present

## 2024-01-19 ENCOUNTER — Ambulatory Visit: Payer: Medicare HMO

## 2024-01-19 DIAGNOSIS — I441 Atrioventricular block, second degree: Secondary | ICD-10-CM | POA: Diagnosis not present

## 2024-01-21 LAB — CUP PACEART REMOTE DEVICE CHECK
Battery Remaining Longevity: 82 mo
Battery Remaining Percentage: 78 %
Battery Voltage: 2.99 V
Brady Statistic AP VP Percent: 72 %
Brady Statistic AP VS Percent: 1 %
Brady Statistic AS VP Percent: 28 %
Brady Statistic AS VS Percent: 1 %
Brady Statistic RA Percent Paced: 72 %
Brady Statistic RV Percent Paced: 99 %
Date Time Interrogation Session: 20251215020013
Implantable Lead Connection Status: 753985
Implantable Lead Connection Status: 753985
Implantable Lead Implant Date: 20231214
Implantable Lead Implant Date: 20231214
Implantable Lead Location: 753859
Implantable Lead Location: 753860
Implantable Pulse Generator Implant Date: 20231214
Lead Channel Impedance Value: 390 Ohm
Lead Channel Impedance Value: 480 Ohm
Lead Channel Pacing Threshold Amplitude: 0.75 V
Lead Channel Pacing Threshold Amplitude: 0.75 V
Lead Channel Pacing Threshold Pulse Width: 0.5 ms
Lead Channel Pacing Threshold Pulse Width: 0.5 ms
Lead Channel Sensing Intrinsic Amplitude: 0.9 mV
Lead Channel Sensing Intrinsic Amplitude: 12 mV
Lead Channel Setting Pacing Amplitude: 1 V
Lead Channel Setting Pacing Amplitude: 2.5 V
Lead Channel Setting Pacing Pulse Width: 0.5 ms
Lead Channel Setting Sensing Sensitivity: 2 mV
Pulse Gen Model: 2272
Pulse Gen Serial Number: 8128493

## 2024-01-22 ENCOUNTER — Ambulatory Visit: Payer: Self-pay | Admitting: Cardiology

## 2024-01-23 NOTE — Progress Notes (Signed)
 Remote PPM Transmission
